# Patient Record
Sex: Female | Born: 1990 | Race: White | Hispanic: No | Marital: Married | Smoking: Never smoker
Health system: Southern US, Community
[De-identification: ages and names within clinical notes are randomized; demographics above are authoritative.]

## PROBLEM LIST (undated history)

## (undated) DIAGNOSIS — O24419 Gestational diabetes mellitus in pregnancy, unspecified control: Secondary | ICD-10-CM

## (undated) DIAGNOSIS — Z789 Other specified health status: Secondary | ICD-10-CM

## (undated) HISTORY — DX: Gestational diabetes mellitus in pregnancy, unspecified control: O24.419

## (undated) HISTORY — PX: NO PAST SURGERIES: SHX2092

---

## 2021-05-12 ENCOUNTER — Other Ambulatory Visit: Payer: Self-pay

## 2021-05-12 ENCOUNTER — Ambulatory Visit (INDEPENDENT_AMBULATORY_CARE_PROVIDER_SITE_OTHER): Admitting: Obstetrics and Gynecology

## 2021-05-12 ENCOUNTER — Encounter: Payer: Self-pay | Admitting: Obstetrics and Gynecology

## 2021-05-12 VITALS — BP 127/84 | HR 87 | Ht 63.0 in | Wt 164.3 lb

## 2021-05-12 DIAGNOSIS — Z7689 Persons encountering health services in other specified circumstances: Secondary | ICD-10-CM

## 2021-05-12 DIAGNOSIS — Z3200 Encounter for pregnancy test, result unknown: Secondary | ICD-10-CM

## 2021-05-12 LAB — POCT URINE PREGNANCY: Preg Test, Ur: POSITIVE — AB

## 2021-05-12 NOTE — Progress Notes (Signed)
HPI:      Ms. Benigna Delisi is a 30 y.o. G4P1003 who LMP was Patient's last menstrual period was 03/26/2021.  Subjective:   She presents today for pregnancy confirmation.  She is approximately 6 weeks estimated gestational age based on last menstrual period. She has 3 living children-vaginal births. Her pregnancies have been complicated by preeclampsia at term x2 and 1 induced at 36 weeks for PIH. She is interested in breast-feeding-she breast-fed one of her 3 children. She has significant nausea and occasional vomiting at this time.    Hx: The following portions of the patient's history were reviewed and updated as appropriate:             She  has no past medical history on file. She does not have a problem list on file. She  has no past surgical history on file. Her family history is not on file. She  has no history on file for tobacco use, alcohol use, and drug use. She has a current medication list which includes the following prescription(s): prenatal vit-fe fumarate-fa. She has no allergies on file.       Review of Systems:  Review of Systems  Constitutional: Denied constitutional symptoms, night sweats, recent illness, fatigue, fever, insomnia and weight loss.  Eyes: Denied eye symptoms, eye pain, photophobia, vision change and visual disturbance.  Ears/Nose/Throat/Neck: Denied ear, nose, throat or neck symptoms, hearing loss, nasal discharge, sinus congestion and sore throat.  Cardiovascular: Denied cardiovascular symptoms, arrhythmia, chest pain/pressure, edema, exercise intolerance, orthopnea and palpitations.  Respiratory: Denied pulmonary symptoms, asthma, pleuritic pain, productive sputum, cough, dyspnea and wheezing.  Gastrointestinal: Denied, gastro-esophageal reflux, melena, nausea and vomiting.  Genitourinary: Denied genitourinary symptoms including symptomatic vaginal discharge, pelvic relaxation issues, and urinary complaints.  Musculoskeletal: Denied  musculoskeletal symptoms, stiffness, swelling, muscle weakness and myalgia.  Dermatologic: Denied dermatology symptoms, rash and scar.  Neurologic: Denied neurology symptoms, dizziness, headache, neck pain and syncope.  Psychiatric: Denied psychiatric symptoms, anxiety and depression.  Endocrine: Denied endocrine symptoms including hot flashes and night sweats.   Meds:   Current Outpatient Medications on File Prior to Visit  Medication Sig Dispense Refill   Prenatal Vit-Fe Fumarate-FA (PRENATAL PO) Take by mouth.     No current facility-administered medications on file prior to visit.        The pregnancy intention screening data noted above was reviewed. Potential methods of contraception were discussed. The patient elected to proceed with Pregnant/Seeking Pregnancy.     Objective:     Vitals:   05/12/21 1003  BP: 127/84  Pulse: 87   Filed Weights   05/12/21 1003  Weight: 164 lb 4.8 oz (74.5 kg)              Urinary pregnancy test positive  Assessment:    G4P1003 There are no problems to display for this patient.    1. Possible pregnancy, not yet confirmed   2. Encounter to establish care     Approximately 6 weeks estimated gestational age  Significant history includes preeclampsia follow-up 3 prior births.   Plan:            Prenatal Plan 1.  The patient was given prenatal literature. 2.  She was continued on prenatal vitamins. 3.  A prenatal lab panel to be drawn at nurse visit. 4.  An ultrasound was ordered to better determine an EDC. 5.  A nurse visit was scheduled. 6.  Genetic testing and testing for other inheritable conditions discussed in detail.  She will decide in the future whether to have these labs performed. 7.  A general overview of pregnancy testing, visit schedule, ultrasound schedule, and prenatal care was discussed. 8.  COVID and its risks associated with pregnancy, prevention by limiting exposure and use of masks, as well as the risks  and benefits of vaccination during pregnancy were discussed in detail.  [facility] policy regarding office and hospital visitation and testing was explained. 9.  Benefits of breast-feeding discussed in detail including both maternal and infant benefits. Ready Set Baby website discussed. 10.  Literature on nausea and vomiting of pregnancy given 11.  Begin baby aspirin at approximately 13 weeks for history of preeclampsia  Orders Orders Placed This Encounter  Procedures   POCT urine pregnancy    No orders of the defined types were placed in this encounter.     F/U  Return in about 6 weeks (around 06/23/2021). I spent 32 minutes involved in the care of this patient preparing to see the patient by obtaining and reviewing her medical history (including labs, imaging tests and prior procedures), documenting clinical information in the electronic health record (EHR), counseling and coordinating care plans, writing and sending prescriptions, ordering tests or procedures and directly communicating with the patient by discussing pertinent items from her history and physical exam as well as detailing my assessment and plan as noted above so that she has an informed understanding.  All of her questions were answered.  Elonda Husky, M.D. 05/12/2021 10:36 AM

## 2021-05-16 ENCOUNTER — Telehealth: Payer: Self-pay | Admitting: Obstetrics and Gynecology

## 2021-05-16 NOTE — Telephone Encounter (Signed)
Pt aware charge correct dos for 7/1- done.

## 2021-05-16 NOTE — Telephone Encounter (Signed)
Kayla Reid called in and state son Friday July 1st when she was in the office, there were issues with trying to get her insurance verified.  She called to make sure it verified this morning.  I put in her information and it did verify.  She just wants to make sure her visit from July 1st gets filed under her insurance instead of self pay.

## 2021-06-02 ENCOUNTER — Ambulatory Visit (INDEPENDENT_AMBULATORY_CARE_PROVIDER_SITE_OTHER): Admitting: Obstetrics and Gynecology

## 2021-06-02 ENCOUNTER — Other Ambulatory Visit: Payer: Self-pay

## 2021-06-02 VITALS — BP 134/84 | HR 86 | Ht 63.0 in | Wt 165.7 lb

## 2021-06-02 DIAGNOSIS — Z3A09 9 weeks gestation of pregnancy: Secondary | ICD-10-CM

## 2021-06-02 DIAGNOSIS — Z3481 Encounter for supervision of other normal pregnancy, first trimester: Secondary | ICD-10-CM

## 2021-06-02 LAB — OB RESULTS CONSOLE VARICELLA ZOSTER ANTIBODY, IGG: Varicella: IMMUNE

## 2021-06-02 NOTE — Progress Notes (Signed)
Kayla Reid presents for NOB nurse interview visit. Pregnancy confirmation done 06/02/2021.  G4 .  P1003. Pregnancy education material explained and given. 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 NOB labs ordered.Body mass index is 29.35 kg/m. HIV labs and Drug screen were explained optional and she did not decline. Drug screen ordered. PNV encouraged. Genetic screening options discussed. Genetic testing: Unsure.  Pt to discuss with provider.  Financial policy reviewed. FMLA from reviewed and signed. Pt. To follow up with provider in 3 weeks for NOB physical.  All questions answered.

## 2021-06-03 LAB — MONITOR DRUG PROFILE 14(MW)
Amphetamine Scrn, Ur: NEGATIVE ng/mL
BARBITURATE SCREEN URINE: NEGATIVE ng/mL
BENZODIAZEPINE SCREEN, URINE: NEGATIVE ng/mL
Buprenorphine, Urine: NEGATIVE ng/mL
CANNABINOIDS UR QL SCN: NEGATIVE ng/mL
Cocaine (Metab) Scrn, Ur: NEGATIVE ng/mL
Creatinine(Crt), U: 185.7 mg/dL (ref 20.0–300.0)
Fentanyl, Urine: NEGATIVE pg/mL
Meperidine Screen, Urine: NEGATIVE ng/mL
Methadone Screen, Urine: NEGATIVE ng/mL
OXYCODONE+OXYMORPHONE UR QL SCN: NEGATIVE ng/mL
Opiate Scrn, Ur: NEGATIVE ng/mL
Ph of Urine: 7.2 (ref 4.5–8.9)
Phencyclidine Qn, Ur: NEGATIVE ng/mL
Propoxyphene Scrn, Ur: NEGATIVE ng/mL
SPECIFIC GRAVITY: 1.028
Tramadol Screen, Urine: NEGATIVE ng/mL

## 2021-06-03 LAB — URINALYSIS, ROUTINE W REFLEX MICROSCOPIC
Bilirubin, UA: NEGATIVE
Glucose, UA: NEGATIVE
Ketones, UA: NEGATIVE
Leukocytes,UA: NEGATIVE
Nitrite, UA: NEGATIVE
RBC, UA: NEGATIVE
Specific Gravity, UA: 1.026 (ref 1.005–1.030)
Urobilinogen, Ur: 1 mg/dL (ref 0.2–1.0)
pH, UA: 7 (ref 5.0–7.5)

## 2021-06-04 LAB — URINE CULTURE, OB REFLEX

## 2021-06-04 LAB — CULTURE, OB URINE

## 2021-06-06 LAB — GC/CHLAMYDIA PROBE AMP
Chlamydia trachomatis, NAA: NEGATIVE
Neisseria Gonorrhoeae by PCR: NEGATIVE

## 2021-06-08 LAB — AB SCR+ANTIBODY ID: Antibody Screen: POSITIVE — AB

## 2021-06-08 LAB — TOXOPLASMA ANTIBODIES- IGG AND  IGM
Toxoplasma Antibody- IgM: <3 AU/mL (ref 0.0–7.9)
Toxoplasma IgG Ratio: <3 IU/mL (ref 0.0–7.1)

## 2021-06-08 LAB — VARICELLA ZOSTER ANTIBODY, IGG: Varicella zoster IgG: 635 index (ref >165)

## 2021-06-08 LAB — NICOTINE/COTININE METABOLITES
Cotinine: <1 ng/mL
Nicotine: <1 ng/mL

## 2021-06-08 LAB — ANTIBODY SCREEN

## 2021-06-08 LAB — RPR: RPR Ser Ql: NONREACTIVE

## 2021-06-08 LAB — ABO AND RH: Rh Factor: POSITIVE

## 2021-06-08 LAB — RUBELLA SCREEN: Rubella Antibodies, IGG: 5.21 index (ref >0.99)

## 2021-06-08 LAB — HIV ANTIBODY (ROUTINE TESTING W REFLEX): HIV Screen 4th Generation wRfx: NONREACTIVE

## 2021-06-08 LAB — HEPATITIS B SURFACE ANTIGEN: Hepatitis B Surface Ag: NEGATIVE

## 2021-06-19 ENCOUNTER — Other Ambulatory Visit: Payer: Self-pay

## 2021-06-19 ENCOUNTER — Ambulatory Visit
Admission: RE | Admit: 2021-06-19 | Discharge: 2021-06-19 | Disposition: A | Discharge status: Home / Self Care | Source: Ambulatory Visit | Attending: Obstetrics and Gynecology | Admitting: Obstetrics and Gynecology

## 2021-06-19 DIAGNOSIS — Z3200 Encounter for pregnancy test, result unknown: Secondary | ICD-10-CM | POA: Diagnosis not present

## 2021-06-22 NOTE — Patient Instructions (Signed)
WHAT OB PATIENTS CAN EXPECT  Confirmation of pregnancy and ultrasound ordered if medically indicated-[redacted] weeks gestation New OB (NOB) intake with nurse and New OB (NOB) labs- [redacted] weeks gestation New OB (NOB) physical examination with provider- 11/[redacted] weeks gestation Flu vaccine-[redacted] weeks gestation Anatomy scan-[redacted] weeks gestation Glucose tolerance test, blood work to test for anemia, T-dap vaccine-[redacted] weeks gestation Vaginal swabs/cultures-STD/Group B strep-[redacted] weeks gestation Appointments every 4 weeks until 28 weeks Every 2 weeks from 28 weeks until 36 weeks Weekly visits from 36 weeks until delivery  Second Trimester of Pregnancy  The second trimester of pregnancy is from week 13 through week 27. This is also called months 4 through 6 of pregnancy. This is often the time when you feelyour best. During the second trimester: Morning sickness is less or has stopped. You may have more energy. You may feel hungry more often. At this time, your unborn baby (fetus) is growing very fast. At the end of the sixth month, the unborn baby may be up to 12 inches long and weigh about 1 pounds. You will likely start to feelthe baby move between 16 and 20 weeks of pregnancy. Body changes during your second trimester Your body continues to go through many changes during this time. The changesvary and generally return to normal after the baby is born. Physical changes You will gain more weight. You may start to get stretch marks on your hips, belly (abdomen), and breasts. Your breasts will grow and may hurt. Dark spots or blotches may develop on your face. A dark line from your belly button to the pubic area (linea nigra) may appear. You may have changes in your hair. Health changes You may have headaches. You may have heartburn. You may have trouble pooping (constipation). You may have hemorrhoids or swollen, bulging veins (varicose veins). Your gums may bleed. You may pee (urinate) more often. You may  have back pain. Follow these instructions at home: Medicines Take over-the-counter and prescription medicines only as told by your doctor. Some medicines are not safe during pregnancy. Take a prenatal vitamin that contains at least 600 micrograms (mcg) of folic acid. Eating and drinking Eat healthy meals that include: Fresh fruits and vegetables. Whole grains. Good sources of protein, such as meat, eggs, or tofu. Low-fat dairy products. Avoid raw meat and unpasteurized juice, milk, and cheese. You may need to take these actions to prevent or treat trouble pooping: Drink enough fluids to keep your pee (urine) pale yellow. Eat foods that are high in fiber. These include beans, whole grains, and fresh fruits and vegetables. Limit foods that are high in fat and sugar. These include fried or sweet foods. Activity Exercise only as told by your doctor. Most people can do their usual exercise during pregnancy. Try to exercise for 30 minutes at least 5 days a week. Stop exercising if you have pain or cramps in your belly or lower back. Do not exercise if it is too hot or too humid, or if you are in a place of great height (high altitude). Avoid heavy lifting. If you choose to, you may have sex unless your doctor tells you not to. Relieving pain and discomfort Wear a good support bra if your breasts are sore. Take warm water baths (sitz baths) to soothe pain or discomfort caused by hemorrhoids. Use hemorrhoid cream if your doctor approves. Rest with your legs raised (elevated) if you have leg cramps or low back pain. If you develop bulging veins in your legs: Wear  support hose as told by your doctor. Raise your feet for 15 minutes, 3-4 times a day. Limit salt in your food. Safety Wear your seat belt at all times when you are in a car. Talk with your doctor if someone is hurting you or yelling at you a lot. Lifestyle Do not use hot tubs, steam rooms, or saunas. Do not douche. Do not use  tampons or scented sanitary pads. Avoid cat litter boxes and soil used by cats. These carry germs that can harm your baby and can cause a loss of your baby by miscarriage or stillbirth. Do not use herbal medicines, illegal drugs, or medicines that are not approved by your doctor. Do not drink alcohol. Do not smoke or use any products that contain nicotine or tobacco. If you need help quitting, ask your doctor. General instructions Keep all follow-up visits. This is important. Ask your doctor about local prenatal classes. Ask your doctor about the right foods to eat or for help finding a counselor. Where to find more information American Pregnancy Association: americanpregnancy.org [employer]: www.acog.org Office on [employer]: KeywordPortfolios.com.br Contact a doctor if: You have a headache that does not go away when you take medicine. You have changes in how you see, or you see spots in front of your eyes. You have mild cramps, pressure, or pain in your lower belly. You continue to feel like you may vomit (nauseous), you vomit, or you have watery poop (diarrhea). You have bad-smelling fluid coming from your vagina. You have pain when you pee or your pee smells bad. You have very bad swelling of your face, hands, ankles, feet, or legs. You have a fever. Get help right away if: You are leaking fluid from your vagina. You have spotting or bleeding from your vagina. You have very bad belly cramping or pain. You have trouble breathing. You have chest pain. You faint. You have not felt your baby move for the time period told by your doctor. You have new or increased pain, swelling, or redness in an arm or leg. Summary The second trimester of pregnancy is from week 13 through week 27 (months 4 through 6). Eat healthy meals. Exercise as told by your doctor. Most people can do their usual exercise during pregnancy. Do not use herbal medicines,  illegal drugs, or medicines that are not approved by your doctor. Do not drink alcohol. Call your doctor if you get sick or if you notice anything unusual about your pregnancy. This information is not intended to replace advice given to you by your health care provider. Make sure you discuss any questions you have with your healthcare provider. Document Revised: 04/06/2020 Document Reviewed: 02/11/2020 Elsevier Patient Education  [place]. Common Medications Safe in Pregnancy  Acne:      Constipation:  Benzoyl Peroxide     Colace  Clindamycin      Dulcolax Suppository  Topica Erythromycin     Fibercon  Salicylic Acid      Metamucil         Miralax AVOID:        Senakot   Accutane    Cough:  Retin-A       Cough Drops  Tetracycline      Phenergan w/ Codeine if Rx  Minocycline      Robitussin (Plain & DM)  Antibiotics:     Crabs/Lice:  Ceclor       RID  Cephalosporins    AVOID:  E-Mycins  Kwell  Keflex  Macrobid/Macrodantin   Diarrhea:  Penicillin      Kao-Pectate  Zithromax      Imodium AD         PUSH FLUIDS AVOID:       Cipro     Fever:  Tetracycline      Tylenol (Regular or Extra  Minocycline       Strength)  Levaquin      Extra Strength-Do not          Exceed 8 tabs/24 hrs Caffeine:        <229m/day (equiv. To 1 cup of coffee or  approx. 3 12 oz sodas)         Gas: Cold/Hayfever:       Gas-X  Benadryl      Mylicon  Claritin       Phazyme  **Claritin-D        Chlor-Trimeton    Headaches:  Dimetapp      ASA-Free Excedrin  Drixoral-Non-Drowsy     Cold Compress  Mucinex (Guaifenasin)     Tylenol (Regular or Extra  Sudafed/Sudafed-12 Hour     Strength)  **Sudafed PE Pseudoephedrine   Tylenol Cold & Sinus     Vicks Vapor Rub  Zyrtec  **AVOID if Problems With Blood Pressure         Heartburn: Avoid lying down for at least 1 hour after meals  Aciphex      Maalox     Rash:  Milk of Magnesia     Benadryl    Mylanta       1% Hydrocortisone  Cream  Pepcid  Pepcid Complete   Sleep Aids:  Prevacid      Ambien   Prilosec       Benadryl  Rolaids       Chamomile Tea  Tums (Limit 4/day)     Unisom         Tylenol PM         Warm milk-add vanilla or  Hemorrhoids:       Sugar for taste  Anusol/Anusol H.C.  (RX: Analapram 2.5%)  Sugar Substitutes:  Hydrocortisone OTC     Ok in moderation  Preparation H      Tucks        Vaseline lotion applied to tissue with wiping    Herpes:     Throat:  Acyclovir      Oragel  Famvir  Valtrex     Vaccines:         Flu Shot Leg Cramps:       *Gardasil  Benadryl      Hepatitis A         Hepatitis B Nasal Spray:       Pneumovax  Saline Nasal Spray     Polio Booster         Tetanus Nausea:       Tuberculosis test or PPD  Vitamin B6 25 mg TID   AVOID:    Dramamine      *Gardasil  Emetrol       Live Poliovirus  Ginger Root 250 mg QID    MMR (measles, mumps &  High Complex Carbs @ Bedtime    rebella)  Sea Bands-Accupressure    Varicella (Chickenpox)  Unisom 1/2 tab TID     *No known complications           If received before Pain:         Known pregnancy;  Darvocet       Resume series after  Lortab        Delivery  Percocet    Yeast:   Tramadol      Femstat  Tylenol 3      Gyne-lotrimin  Ultram       Monistat  Vicodin           MISC:         All Sunscreens           Hair Coloring/highlights          Insect Repellant's          (Including DEET)         Mystic Tans

## 2021-06-23 ENCOUNTER — Encounter: Payer: Self-pay | Admitting: Obstetrics and Gynecology

## 2021-06-23 ENCOUNTER — Other Ambulatory Visit (HOSPITAL_COMMUNITY)
Admission: RE | Admit: 2021-06-23 | Discharge: 2021-06-23 | Disposition: A | Discharge status: Home / Self Care | Source: Ambulatory Visit | Attending: Obstetrics and Gynecology | Admitting: Obstetrics and Gynecology

## 2021-06-23 ENCOUNTER — Ambulatory Visit (INDEPENDENT_AMBULATORY_CARE_PROVIDER_SITE_OTHER): Admitting: Obstetrics and Gynecology

## 2021-06-23 ENCOUNTER — Other Ambulatory Visit: Payer: Self-pay

## 2021-06-23 VITALS — BP 142/87 | HR 80 | Wt 161.8 lb

## 2021-06-23 DIAGNOSIS — Z124 Encounter for screening for malignant neoplasm of cervix: Secondary | ICD-10-CM

## 2021-06-23 DIAGNOSIS — Z3A12 12 weeks gestation of pregnancy: Secondary | ICD-10-CM

## 2021-06-23 DIAGNOSIS — Z3481 Encounter for supervision of other normal pregnancy, first trimester: Secondary | ICD-10-CM

## 2021-06-23 DIAGNOSIS — Z3491 Encounter for supervision of normal pregnancy, unspecified, first trimester: Secondary | ICD-10-CM

## 2021-06-23 LAB — POCT URINALYSIS DIPSTICK OB
Bilirubin, UA: NEGATIVE
Blood, UA: NEGATIVE
Glucose, UA: NEGATIVE
Ketones, UA: NEGATIVE
Leukocytes, UA: NEGATIVE
Nitrite, UA: NEGATIVE
POC,PROTEIN,UA: NEGATIVE
Spec Grav, UA: 1.015 (ref 1.010–1.025)
Urobilinogen, UA: 0.2 E.U./dL
pH, UA: 6.5 (ref 5.0–8.0)

## 2021-06-23 NOTE — Progress Notes (Signed)
NOB: Currently doing well.  Nausea and vomiting have decreased.  Patient declined genetic testing.  She does desire quad screen at 16 weeks.  She has begun aspirin as directed because of her history of preeclampsia.  Pap performed.  Physical examination General NAD, Conversant  HEENT Atraumatic; Op clear with mmm.  Normo-cephalic. Pupils reactive. Anicteric sclerae  Thyroid/Neck Smooth without nodularity or enlargement. Normal ROM.  Neck Supple.  Skin No rashes, lesions or ulceration. Normal palpated skin turgor. No nodularity.  Breasts: No masses or discharge.  Symmetric.  No axillary adenopathy.  Lungs: Clear to auscultation.No rales or wheezes. Normal Respiratory effort, no retractions.  Heart: NSR.  No murmurs or rubs appreciated. No periferal edema  Abdomen: Soft.  Non-tender.  No masses.  No HSM. No hernia  Extremities: Moves all appropriately.  Normal ROM for age. No lymphadenopathy.  Neuro: Oriented to PPT.  Normal mood. Normal affect.     Pelvic:   Vulva: Normal appearance.  No lesions.  Vagina: No lesions or abnormalities noted.  Support: Normal pelvic support.  Urethra No masses tenderness or scarring.  Meatus Normal size without lesions or prolapse.  Cervix: Normal appearance.  No lesions.  Anus: Normal exam.  No lesions.  Perineum: Normal exam.  No lesions.        Bimanual   Adnexae: No masses.  Non-tender to palpation.  Uterus: Enlarged. Pos FHTs  Non-tender.  Mobile.  AV.  Adnexae: No masses.  Non-tender to palpation.  Cul-de-sac: Negative for abnormality.  Adnexae: No masses.  Non-tender to palpation.         Pelvimetry   Diagonal: Reached.  Spines: Average.  Sacrum: Concave.  Pubic Arch: Normal.

## 2021-06-23 NOTE — Progress Notes (Signed)
NOB-PE for initial prenatal care. Pt stated that she was doing well. Pt declined genetic testing. Pt stated noticing light bloody show. Pt stated that her last pap smear was about a year ago.

## 2021-06-28 LAB — CYTOLOGY - PAP
Comment: NEGATIVE
Diagnosis: NEGATIVE
High risk HPV: NEGATIVE

## 2021-07-18 ENCOUNTER — Telehealth: Payer: Self-pay

## 2021-07-18 NOTE — Telephone Encounter (Signed)
Call transferred from front desk-   OB 16 2/7 Pt woke up from a nap and has blurred vision in the rt eye. BP- 159 95 at 430  Mild dizziness. No H/a. Pt has had high bp in every pregnancy. Is taking asa 81mg . Not taking bp meds.   Pt advised to take a bp log this evening. Appt for bp check at 830. IF pt has increased dizziness or H/a not relieved with tylenol to go to the ed. Pt voiced understanding.

## 2021-07-19 ENCOUNTER — Other Ambulatory Visit: Payer: Self-pay

## 2021-07-19 ENCOUNTER — Ambulatory Visit (INDEPENDENT_AMBULATORY_CARE_PROVIDER_SITE_OTHER): Admitting: Obstetrics and Gynecology

## 2021-07-19 VITALS — BP 123/84 | HR 83 | Ht 63.0 in | Wt 163.8 lb

## 2021-07-19 DIAGNOSIS — Z0130 Encounter for examination of blood pressure without abnormal findings: Secondary | ICD-10-CM

## 2021-07-19 DIAGNOSIS — Z3A16 16 weeks gestation of pregnancy: Secondary | ICD-10-CM

## 2021-07-19 DIAGNOSIS — Z3482 Encounter for supervision of other normal pregnancy, second trimester: Secondary | ICD-10-CM | POA: Diagnosis not present

## 2021-07-19 LAB — POCT URINALYSIS DIPSTICK OB
Bilirubin, UA: NEGATIVE
Blood, UA: NEGATIVE
Glucose, UA: NEGATIVE
Ketones, UA: NEGATIVE
Leukocytes, UA: NEGATIVE
Nitrite, UA: NEGATIVE
POC,PROTEIN,UA: NEGATIVE
Spec Grav, UA: <=1.005 — AB (ref 1.010–1.025)
Urobilinogen, UA: 0.2 E.U./dL
pH, UA: 6.5 (ref 5.0–8.0)

## 2021-07-19 NOTE — Patient Instructions (Signed)

## 2021-07-19 NOTE — Progress Notes (Signed)
OB pt called yesterday stating that she had elevated bp. Pt stated that her bp were elevated. Pt stated today that she is having blurred vision in the right eye, no headaches, dizziness, vomiting 4x a day, this morning when she urinated she noticed a quarter size clot come out, afterwards light bleeding which has stopped, light pain in right pelvic area and tick on her right ankle.

## 2021-07-19 NOTE — Progress Notes (Signed)
HPI:      [name] is a 30 y.o. [phone] who LMP was Patient's last menstrual period was 03/26/2021.  Subjective:   She presents today for a blood pressure check because she was concerned that her blood pressure could possibly be high.  She also informed the nurse that she had an episode of vaginal bleeding.  She denies contractions denies any further bleeding since that time.  She was very concerned that she could be having a miscarriage.    Hx: The following portions of the patient's history were reviewed and updated as appropriate:             She  has no past medical history on file. She does not have a problem list on file. She  has no past surgical history on file. Her family history includes Diabetes in her maternal grandmother and paternal grandmother; Healthy in her mother; Hypertension in her father; Multiple sclerosis in her father. She  reports that she has never smoked. She has never used smokeless tobacco. She reports that she does not drink alcohol and does not use drugs. She has a current medication list which includes the following prescription(s): aspirin and prenatal vit-fe fumarate-fa. She has No Known Allergies.       Review of Systems:  Review of Systems  Constitutional: Denied constitutional symptoms, night sweats, recent illness, fatigue, fever, insomnia and weight loss.  Eyes: Denied eye symptoms, eye pain, photophobia, vision change and visual disturbance.  Ears/Nose/Throat/Neck: Denied ear, nose, throat or neck symptoms, hearing loss, nasal discharge, sinus congestion and sore throat.  Cardiovascular: Denied cardiovascular symptoms, arrhythmia, chest pain/pressure, edema, exercise intolerance, orthopnea and palpitations.  Respiratory: Denied pulmonary symptoms, asthma, pleuritic pain, productive sputum, cough, dyspnea and wheezing.  Gastrointestinal: Denied, gastro-esophageal reflux, melena, nausea and vomiting.  Genitourinary: See HPI for additional  information.  Musculoskeletal: Denied musculoskeletal symptoms, stiffness, swelling, muscle weakness and myalgia.  Dermatologic: Denied dermatology symptoms, rash and scar.  Neurologic: Denied neurology symptoms, dizziness, headache, neck pain and syncope.  Psychiatric: Denied psychiatric symptoms, anxiety and depression.  Endocrine: Denied endocrine symptoms including hot flashes and night sweats.   Meds:   Current Outpatient Medications on File Prior to Visit  Medication Sig Dispense Refill   BABY ASPIRIN PO Take by mouth.     Prenatal Vit-Fe Fumarate-FA (PRENATAL PO) Take by mouth.     No current facility-administered medications on file prior to visit.      Objective:     Vitals:   07/19/21 0825  BP: 123/84  Pulse: 83   Filed Weights   07/19/21 0825  Weight: 163 lb 12.8 oz (74.3 kg)              Fetal heart tones noted at 156.          Assessment:    [id number] There are no problems to display for this patient.    1. Encounter for supervision of other normal pregnancy in second trimester   2. [redacted] weeks gestation of pregnancy     Patient had 1 episode of spotting which has now resolved.  She was concerned for miscarriage   Plan:            1.  Fetal heart tones heard-patient reassured regarding miscarriage.  2.  Blood pressure check today normal Orders Orders Placed This Encounter  Procedures   POC Urinalysis Dipstick OB    No orders of the defined types were placed in this encounter.  F/U  No follow-ups on file. Patient to follow-up as previously scheduled tomorrow.  I spent 15 minutes involved in the care of this patient preparing to see the patient by obtaining and reviewing her medical history (including labs, imaging tests and prior procedures), documenting clinical information in the electronic health record (EHR), counseling and coordinating care plans, writing and sending prescriptions, ordering tests or procedures and in direct communicating  with the patient and medical staff discussing pertinent items from her history and physical exam.  Elonda Husky, M.D. 07/19/2021 9:19 AM

## 2021-07-20 ENCOUNTER — Encounter: Payer: Self-pay | Admitting: Obstetrics and Gynecology

## 2021-07-20 ENCOUNTER — Other Ambulatory Visit: Payer: Self-pay

## 2021-07-20 ENCOUNTER — Ambulatory Visit (INDEPENDENT_AMBULATORY_CARE_PROVIDER_SITE_OTHER): Admitting: Obstetrics and Gynecology

## 2021-07-20 VITALS — BP 108/77 | HR 81 | Wt 163.4 lb

## 2021-07-20 DIAGNOSIS — O0992 Supervision of high risk pregnancy, unspecified, second trimester: Secondary | ICD-10-CM

## 2021-07-20 DIAGNOSIS — Z3482 Encounter for supervision of other normal pregnancy, second trimester: Secondary | ICD-10-CM

## 2021-07-20 DIAGNOSIS — R768 Other specified abnormal immunological findings in serum: Secondary | ICD-10-CM | POA: Insufficient documentation

## 2021-07-20 DIAGNOSIS — O09292 Supervision of pregnancy with other poor reproductive or obstetric history, second trimester: Secondary | ICD-10-CM | POA: Insufficient documentation

## 2021-07-20 DIAGNOSIS — Z3A16 16 weeks gestation of pregnancy: Secondary | ICD-10-CM

## 2021-07-20 DIAGNOSIS — O09213 Supervision of pregnancy with history of pre-term labor, third trimester: Secondary | ICD-10-CM | POA: Insufficient documentation

## 2021-07-20 LAB — POCT URINALYSIS DIPSTICK OB
Bilirubin, UA: NEGATIVE
Blood, UA: NEGATIVE
Glucose, UA: NEGATIVE
Ketones, UA: NEGATIVE
Leukocytes, UA: NEGATIVE
Nitrite, UA: NEGATIVE
POC,PROTEIN,UA: NEGATIVE
Spec Grav, UA: 1.025 (ref 1.010–1.025)
Urobilinogen, UA: 0.2 E.U./dL
pH, UA: 7 (ref 5.0–8.0)

## 2021-07-20 NOTE — Progress Notes (Signed)
ROB: Patient notes she was seen yesterday in office after elevated BP at home and passing a large blood clot. Was experiencing vision changes in left eye.  Notes vision change is still present, but improving. Has had no other episodes of bleeding. BP today normal. Will order baseline PIH labs as patient with prior h/o pre-eclampsia. Discussed risk for cHTN, gestational HTN and pre-eclampsia again this pregnancy.  Reviewed prenatal labs, has Anti-E antibody, is aware. Is taking aspirin daily. Discussed genetic testing, patient notes her insurance does not cover. Discussed cash pay price with patient which she is ok with. Will order Panorama/Hor[facility] today.  For anatomy scan in 4 weeks. RTC in 4 weeks.

## 2021-07-20 NOTE — Progress Notes (Signed)
   OB-Pt present for routine prenatal care. Pt stated fetal movement present; no contractions present; light bloody vaginal bleeding and no changes in vaginal discharge.     Pt stated that she was doing well and a lot better than yesterday when she was seen in the office for vaginal bleeding, elevated bp and abd cramping.

## 2021-07-21 LAB — COMPREHENSIVE METABOLIC PANEL
ALT: 10 IU/L (ref 0–32)
AST: 11 IU/L (ref 0–40)
Albumin/Globulin Ratio: 1.7 (ref 1.2–2.2)
Albumin: 4.3 g/dL (ref 3.8–4.8)
Alkaline Phosphatase: 36 IU/L — ABNORMAL LOW (ref 44–121)
BUN/Creatinine Ratio: 16 (ref 9–23)
BUN: 8 mg/dL (ref 6–20)
Bilirubin Total: 0.3 mg/dL (ref 0.0–1.2)
CO2: 20 mmol/L (ref 20–29)
Calcium: 9 mg/dL (ref 8.7–10.2)
Chloride: 103 mmol/L (ref 96–106)
Creatinine, Ser: 0.5 mg/dL — ABNORMAL LOW (ref 0.57–1.00)
Globulin, Total: 2.5 g/dL (ref 1.5–4.5)
Glucose: 70 mg/dL (ref 65–99)
Potassium: 4.2 mmol/L (ref 3.5–5.2)
Sodium: 137 mmol/L (ref 134–144)
Total Protein: 6.8 g/dL (ref 6.0–8.5)
eGFR: 129 mL/min/{1.73_m2} (ref >59)

## 2021-08-17 ENCOUNTER — Ambulatory Visit (INDEPENDENT_AMBULATORY_CARE_PROVIDER_SITE_OTHER)

## 2021-08-17 ENCOUNTER — Ambulatory Visit (INDEPENDENT_AMBULATORY_CARE_PROVIDER_SITE_OTHER): Admitting: Obstetrics and Gynecology

## 2021-08-17 ENCOUNTER — Other Ambulatory Visit: Payer: Self-pay

## 2021-08-17 ENCOUNTER — Encounter: Payer: Self-pay | Admitting: Obstetrics and Gynecology

## 2021-08-17 VITALS — BP 111/76 | HR 82 | Wt 170.6 lb

## 2021-08-17 DIAGNOSIS — O0992 Supervision of high risk pregnancy, unspecified, second trimester: Secondary | ICD-10-CM

## 2021-08-17 DIAGNOSIS — Z3A20 20 weeks gestation of pregnancy: Secondary | ICD-10-CM

## 2021-08-17 LAB — POCT URINALYSIS DIPSTICK OB
Bilirubin, UA: NEGATIVE
Blood, UA: NEGATIVE
Glucose, UA: NEGATIVE
Ketones, UA: NEGATIVE
Leukocytes, UA: NEGATIVE
Nitrite, UA: NEGATIVE
POC,PROTEIN,UA: NEGATIVE
Spec Grav, UA: 1.01 (ref 1.010–1.025)
Urobilinogen, UA: 0.2 E.U./dL
pH, UA: 6.5 (ref 5.0–8.0)

## 2021-08-17 NOTE — Progress Notes (Signed)
ROB: Blood pressure is excellent today.  She has no complaints.  She is feeling the baby move.  Anatomy ultrasound today. (Already celebrating Halloween!)

## 2021-09-13 ENCOUNTER — Encounter: Payer: Self-pay | Admitting: Obstetrics and Gynecology

## 2021-09-13 ENCOUNTER — Encounter: Admitting: Obstetrics and Gynecology

## 2021-09-22 ENCOUNTER — Ambulatory Visit (INDEPENDENT_AMBULATORY_CARE_PROVIDER_SITE_OTHER): Admitting: Obstetrics and Gynecology

## 2021-09-22 ENCOUNTER — Other Ambulatory Visit: Payer: Self-pay

## 2021-09-22 VITALS — BP 122/79 | HR 92 | Wt 180.8 lb

## 2021-09-22 DIAGNOSIS — Z23 Encounter for immunization: Secondary | ICD-10-CM

## 2021-09-22 DIAGNOSIS — Z3A25 25 weeks gestation of pregnancy: Secondary | ICD-10-CM

## 2021-09-22 DIAGNOSIS — Z8751 Personal history of pre-term labor: Secondary | ICD-10-CM

## 2021-09-22 DIAGNOSIS — O09292 Supervision of pregnancy with other poor reproductive or obstetric history, second trimester: Secondary | ICD-10-CM

## 2021-09-22 DIAGNOSIS — O0992 Supervision of high risk pregnancy, unspecified, second trimester: Secondary | ICD-10-CM

## 2021-09-22 DIAGNOSIS — Z131 Encounter for screening for diabetes mellitus: Secondary | ICD-10-CM

## 2021-09-22 LAB — POCT URINALYSIS DIPSTICK OB
Bilirubin, UA: NEGATIVE
Blood, UA: NEGATIVE
Glucose, UA: NEGATIVE
Ketones, UA: NEGATIVE
Leukocytes, UA: NEGATIVE
Nitrite, UA: NEGATIVE
POC,PROTEIN,UA: NEGATIVE
Spec Grav, UA: 1.01
Urobilinogen, UA: 0.2 U/dL
pH, UA: 7

## 2021-09-22 NOTE — Progress Notes (Signed)
ROB: Patient noting some pelvic pain and pressure.  Notes it is mostly associated with fetal movement. Discussed belly band.  Flu vaccine given today.  RTC in 3 weeks, for OB visit and 28 week labs.

## 2021-09-22 NOTE — Progress Notes (Signed)
ROB; patient reports pelvic pain at a 6/10 pain score.

## 2021-10-12 ENCOUNTER — Ambulatory Visit (INDEPENDENT_AMBULATORY_CARE_PROVIDER_SITE_OTHER): Admitting: Obstetrics and Gynecology

## 2021-10-12 ENCOUNTER — Other Ambulatory Visit

## 2021-10-12 ENCOUNTER — Encounter: Payer: Self-pay | Admitting: Obstetrics and Gynecology

## 2021-10-12 ENCOUNTER — Other Ambulatory Visit: Payer: Self-pay

## 2021-10-12 VITALS — BP 112/78 | HR 94 | Wt 183.2 lb

## 2021-10-12 DIAGNOSIS — Z3A28 28 weeks gestation of pregnancy: Secondary | ICD-10-CM

## 2021-10-12 DIAGNOSIS — Z3483 Encounter for supervision of other normal pregnancy, third trimester: Secondary | ICD-10-CM

## 2021-10-12 DIAGNOSIS — O0992 Supervision of high risk pregnancy, unspecified, second trimester: Secondary | ICD-10-CM

## 2021-10-12 DIAGNOSIS — Z23 Encounter for immunization: Secondary | ICD-10-CM

## 2021-10-12 DIAGNOSIS — Z131 Encounter for screening for diabetes mellitus: Secondary | ICD-10-CM

## 2021-10-12 LAB — POCT URINALYSIS DIPSTICK OB
Bilirubin, UA: NEGATIVE
Blood, UA: NEGATIVE
Glucose, UA: NEGATIVE
Ketones, UA: NEGATIVE
Leukocytes, UA: NEGATIVE
Nitrite, UA: NEGATIVE
POC,PROTEIN,UA: NEGATIVE
Spec Grav, UA: 1.02 (ref 1.010–1.025)
Urobilinogen, UA: 0.2 E.U./dL
pH, UA: 7 (ref 5.0–8.0)

## 2021-10-12 MED ORDER — TETANUS-DIPHTH-ACELL PERTUSSIS 5-2.5-18.5 LF-MCG/0.5 IM SUSY
0.5000 mL | PREFILLED_SYRINGE | Freq: Once | INTRAMUSCULAR | Status: AC
  Administered 2021-10-12: 0.5 mL via INTRAMUSCULAR

## 2021-10-12 NOTE — Progress Notes (Signed)
ROB. Patient states no concerns or complaints. Patient received Tdap vaccine and signed Blood Transfusion consent form.

## 2021-10-12 NOTE — Progress Notes (Signed)
ROB: She is doing well with no issues.  Blood pressure remains good.  Taking vitamins and aspirin as directed.  Reports fetal movement.  1 hour GCT performed today.

## 2021-10-13 LAB — CBC
Hematocrit: 31.3 % — ABNORMAL LOW (ref 34.0–46.6)
Hemoglobin: 11 g/dL — ABNORMAL LOW (ref 11.1–15.9)
MCH: 32 pg (ref 26.6–33.0)
MCHC: 35.1 g/dL (ref 31.5–35.7)
MCV: 91 fL (ref 79–97)
Platelets: 146 10*3/uL — ABNORMAL LOW (ref 150–450)
RBC: 3.44 x10E6/uL — ABNORMAL LOW (ref 3.77–5.28)
RDW: 12.5 % (ref 11.7–15.4)
WBC: 7.1 10*3/uL (ref 3.4–10.8)

## 2021-10-13 LAB — GLUCOSE, 1 HOUR GESTATIONAL: Gestational Diabetes Screen: 164 mg/dL — ABNORMAL HIGH (ref 70–139)

## 2021-10-13 LAB — HEPATITIS C ANTIBODY: Hep C Virus Ab: <0.1 s/co ratio (ref 0.0–0.9)

## 2021-10-13 LAB — RPR: RPR Ser Ql: NONREACTIVE

## 2021-10-14 ENCOUNTER — Other Ambulatory Visit: Payer: Self-pay

## 2021-10-14 ENCOUNTER — Encounter: Payer: Self-pay | Admitting: Obstetrics and Gynecology

## 2021-10-14 ENCOUNTER — Observation Stay
Admission: EM | Admit: 2021-10-14 | Discharge: 2021-10-14 | Disposition: A | Discharge status: Home / Self Care | Attending: Obstetrics and Gynecology | Admitting: Obstetrics and Gynecology

## 2021-10-14 DIAGNOSIS — Z8759 Personal history of other complications of pregnancy, childbirth and the puerperium: Secondary | ICD-10-CM | POA: Diagnosis not present

## 2021-10-14 DIAGNOSIS — Z3A28 28 weeks gestation of pregnancy: Secondary | ICD-10-CM | POA: Diagnosis not present

## 2021-10-14 DIAGNOSIS — O26893 Other specified pregnancy related conditions, third trimester: Secondary | ICD-10-CM

## 2021-10-14 DIAGNOSIS — O09893 Supervision of other high risk pregnancies, third trimester: Secondary | ICD-10-CM | POA: Diagnosis not present

## 2021-10-14 DIAGNOSIS — O4703 False labor before 37 completed weeks of gestation, third trimester: Secondary | ICD-10-CM | POA: Diagnosis not present

## 2021-10-14 DIAGNOSIS — Z8751 Personal history of pre-term labor: Secondary | ICD-10-CM

## 2021-10-14 DIAGNOSIS — O09213 Supervision of pregnancy with history of pre-term labor, third trimester: Secondary | ICD-10-CM | POA: Diagnosis not present

## 2021-10-14 DIAGNOSIS — Z3490 Encounter for supervision of normal pregnancy, unspecified, unspecified trimester: Secondary | ICD-10-CM

## 2021-10-14 HISTORY — DX: Other specified health status: Z78.9

## 2021-10-14 LAB — URINALYSIS, ROUTINE W REFLEX MICROSCOPIC
Bilirubin Urine: NEGATIVE
Glucose, UA: NEGATIVE mg/dL
Hgb urine dipstick: NEGATIVE
Ketones, ur: 40 mg/dL — AB
Nitrite: NEGATIVE
Protein, ur: NEGATIVE mg/dL
Specific Gravity, Urine: 1.015 (ref 1.005–1.030)
pH: 7 (ref 5.0–8.0)

## 2021-10-14 LAB — RUPTURE OF MEMBRANE (ROM)PLUS: Rom Plus: NEGATIVE

## 2021-10-14 MED ORDER — NIFEDIPINE 10 MG PO CAPS
20.0000 mg | ORAL_CAPSULE | Freq: Once | ORAL | Status: AC
  Administered 2021-10-14: 20 mg via ORAL
  Filled 2021-10-14: qty 2

## 2021-10-14 MED ORDER — AMMONIA AROMATIC IN INHA
RESPIRATORY_TRACT | Status: AC
  Filled 2021-10-14: qty 10

## 2021-10-14 MED ORDER — LACTATED RINGERS IV BOLUS
1000.0000 mL | Freq: Once | INTRAVENOUS | Status: AC
  Administered 2021-10-14 (×2): 1000 mL via INTRAVENOUS

## 2021-10-14 MED ORDER — ACETAMINOPHEN 500 MG PO TABS
1000.0000 mg | ORAL_TABLET | Freq: Four times a day (QID) | ORAL | Status: DC | PRN
  Administered 2021-10-14: 1000 mg via ORAL
  Filled 2021-10-14: qty 2

## 2021-10-14 NOTE — Discharge Instructions (Signed)

## 2021-10-14 NOTE — OB Triage Note (Signed)
Patient arrive from ER with complaints of SROM around 1430. Patient states there was  a large gush of fluid when she stood up after voiding with small tinges of pink. Pt states she has had irregular contractions 5-7 apart. Pt states baby is moving well, denies vaginal bleeding. Pt states she has had history of preE- denies HA, epigastric pain, but does complain of floaters on right eye that started on the way here.

## 2021-10-14 NOTE — Final Progress Note (Signed)
L&D OB Triage Note  Kayla Reid is a 30 y.o. [id number] female at [redacted]w[redacted]d, EDD Estimated Date of Delivery: 12/31/21 who presented to triage for complaints of possible leakage of fluids.  She has a history of preterm delivery, and pre-eclampsia in prior pregnancy. She was evaluated by the nurses with findings significant for preterm contractions.  Of note, patient reports intercourse within the past 24 hours. Vital signs stable. An NST was performed and has been reviewed by MD. She was treated with IVF fluids and a dose of Procardia 20 mg.   NST INTERPRETATION: Indications: rule out uterine contractions  Mode: External Baseline Rate (A): 145 bpm Variability: Moderate Accelerations: None Decelerations: None     Contraction Frequency (min): 2-4  Impression: reactive   Labs:  Results for orders placed or performed during the hospital encounter of 10/14/21  ROM Plus ([facility] only)  Result Value Ref Range   Rom Plus NEGATIVE     Plan: NST performed was reviewed and was found to be reactive. She was discharged home with bleeding/preterm labor precautions.  Continue routine prenatal care. Follow up with OB/GYN as previously scheduled.     Hildred Laser, MD [facility]

## 2021-10-14 NOTE — Progress Notes (Signed)
Pt reports feeling decreased contractions. Notified MD at 2224.

## 2021-10-14 NOTE — OB Triage Note (Signed)
Pt discharged home in stable condition by provider. RN provided discharge instructions to pt, including labor precautions. Pt verbalized understanding and all questions answered at this time.

## 2021-10-14 NOTE — Progress Notes (Signed)
Pt reports still feeling contractions after receiving liter bolus of LR. RN notified MD of update. See new orders.

## 2021-10-19 ENCOUNTER — Other Ambulatory Visit

## 2021-10-19 DIAGNOSIS — Z131 Encounter for screening for diabetes mellitus: Secondary | ICD-10-CM

## 2021-10-26 ENCOUNTER — Other Ambulatory Visit

## 2021-10-27 ENCOUNTER — Encounter: Admitting: Obstetrics and Gynecology

## 2021-10-31 ENCOUNTER — Other Ambulatory Visit

## 2021-10-31 ENCOUNTER — Other Ambulatory Visit: Payer: Self-pay

## 2021-10-31 DIAGNOSIS — Z131 Encounter for screening for diabetes mellitus: Secondary | ICD-10-CM

## 2021-11-01 ENCOUNTER — Encounter: Payer: Self-pay | Admitting: Obstetrics and Gynecology

## 2021-11-01 ENCOUNTER — Ambulatory Visit (INDEPENDENT_AMBULATORY_CARE_PROVIDER_SITE_OTHER): Admitting: Obstetrics and Gynecology

## 2021-11-01 VITALS — BP 121/89 | HR 87 | Wt 174.1 lb

## 2021-11-01 DIAGNOSIS — Z8632 Personal history of gestational diabetes: Secondary | ICD-10-CM | POA: Insufficient documentation

## 2021-11-01 DIAGNOSIS — O24410 Gestational diabetes mellitus in pregnancy, diet controlled: Secondary | ICD-10-CM | POA: Insufficient documentation

## 2021-11-01 DIAGNOSIS — O09292 Supervision of pregnancy with other poor reproductive or obstetric history, second trimester: Secondary | ICD-10-CM

## 2021-11-01 DIAGNOSIS — O0993 Supervision of high risk pregnancy, unspecified, third trimester: Secondary | ICD-10-CM

## 2021-11-01 DIAGNOSIS — Z3A31 31 weeks gestation of pregnancy: Secondary | ICD-10-CM

## 2021-11-01 LAB — GESTATIONAL GLUCOSE TOLERANCE
Glucose, Fasting: 103 mg/dL — ABNORMAL HIGH (ref 70–94)
Glucose, GTT - 1 Hour: 197 mg/dL — ABNORMAL HIGH (ref 70–179)
Glucose, GTT - 2 Hour: 200 mg/dL — ABNORMAL HIGH (ref 70–154)
Glucose, GTT - 3 Hour: 146 mg/dL — ABNORMAL HIGH (ref 70–139)

## 2021-11-01 LAB — POCT URINALYSIS DIPSTICK OB
Bilirubin, UA: NEGATIVE
Blood, UA: NEGATIVE
Glucose, UA: NEGATIVE
Ketones, UA: NEGATIVE
Leukocytes, UA: NEGATIVE
Nitrite, UA: NEGATIVE
POC,PROTEIN,UA: NEGATIVE
Spec Grav, UA: 1.01 (ref 1.010–1.025)
Urobilinogen, UA: 0.2 E.U./dL
pH, UA: 7 (ref 5.0–8.0)

## 2021-11-01 NOTE — Progress Notes (Signed)
ROB: She is doing well, no concerns today. 

## 2021-11-01 NOTE — Progress Notes (Signed)
ROB: Doing well, no issues. Reviewed labs, failed 3 hr GTT, referred to [facility].  Plans to breastfeed, desires Vasectomy for contraception. For Tdap today, signed blood consent. Discussed need for growth scan and antenatal testing at 36 weeks.

## 2021-11-09 MED FILL — Lactated Ringer's Solution: INTRAVENOUS | Qty: 1000 | Status: AC

## 2021-11-10 ENCOUNTER — Ambulatory Visit: Admitting: *Deleted

## 2021-11-12 NOTE — L&D Delivery Note (Signed)
° ° °     Delivery Note   Kayla Reid is a 31 y.o. [id number] at [redacted]w[redacted]d Estimated Date of Delivery: 12/31/21  PRE-OPERATIVE DIAGNOSIS:  1) [redacted]w[redacted]d pregnancy.  2) spontaneous labor  POST-OPERATIVE DIAGNOSIS:  1) [redacted]w[redacted]d pregnancy s/p Vaginal, Spontaneous  2) same with viable female infant  Delivery Type: Vaginal, Spontaneous    Delivery Anesthesia: None     ESTIMATED BLOOD LOSS: 275 ml    FINDINGS:   1) female infant, Apgar scores of 7   at 1 minute and 9   at 5 minutes and a birthweight of 123.81  ounces.    2) Nuchal cord: No  SPECIMENS:   PLACENTA:   Appearance: Intact    Removal: Spontaneous      Disposition:    DISPOSITION:  Infant to left in stable condition in the delivery room, with L&D personnel and mother,  NARRATIVE SUMMARY: Labor course:  [name] is a [id number] at [redacted]w[redacted]d who presented for labor management having regular contractions.  She was found to be 9 cm upon admission.  Artificial rupture of membranes was performed and clear fluid was noted.  Within 10 minutes patient felt the urge to push and she was found to be completely dilated.  She pushed only a few times and delivered a viable female infant. The placenta delivered without problems and was noted to be complete. A perineal and vaginal examination was performed. Episiotomy/Lacerations: 1st degree  Episiotomy or lacerations were repaired with Vicryl suture. The patient tolerated this well.  Elonda Husky, M.D. 12/21/2021 8:19 AM

## 2021-11-14 ENCOUNTER — Encounter: Attending: Obstetrics and Gynecology | Admitting: *Deleted

## 2021-11-14 ENCOUNTER — Telehealth: Payer: Self-pay | Admitting: Obstetrics and Gynecology

## 2021-11-14 ENCOUNTER — Encounter: Payer: Self-pay | Admitting: *Deleted

## 2021-11-14 ENCOUNTER — Other Ambulatory Visit: Payer: Self-pay

## 2021-11-14 VITALS — BP 92/60 | Ht 63.0 in | Wt 187.2 lb

## 2021-11-14 DIAGNOSIS — Z3A00 Weeks of gestation of pregnancy not specified: Secondary | ICD-10-CM | POA: Insufficient documentation

## 2021-11-14 DIAGNOSIS — O24410 Gestational diabetes mellitus in pregnancy, diet controlled: Secondary | ICD-10-CM | POA: Insufficient documentation

## 2021-11-14 NOTE — Telephone Encounter (Signed)
Pt had consult with nutrion for diabetes during pregnancy die to insurance she needs rx for glucometer  meter strips and lancets "Freestyle Lite". Confirmed pharmacy as [facility] in [place]. Please Advise.

## 2021-11-14 NOTE — Progress Notes (Signed)
Diabetes Self-Management Education  Visit Type: First/Initial  Appt. Start Time: 0825 Appt. End Time: 0930  11/14/2021  [name], identified by name and date of birth, is a 31 y.o. female with a diagnosis of Diabetes: Gestational Diabetes.   ASSESSMENT  Blood pressure 92/60, height 5\' 3"  (1.6 m), weight 187 lb 3.2 oz (84.9 kg), last menstrual period 03/26/2021, estimated date of delivery 12/31/2021 Body mass index is 33.16 kg/m.   Diabetes Self-Management Education - 11/14/21 0947       Visit Information   Visit Type First/Initial      Initial Visit   Diabetes Type Gestational Diabetes    Are you currently following a meal plan? No    Are you taking your medications as prescribed? Yes    Date Diagnosed 2 weeks      Health Coping   How would you rate your overall health? Good      Psychosocial Assessment   Patient Belief/Attitude about Diabetes Other (comment)   "nervous"   Self-care barriers None    Self-management support Doctor's office;Family    Patient Concerns Nutrition/Meal planning;Glycemic Control;Weight Control    Special Needs None    Preferred Learning Style Visual;Other (comment)   talking/discussion   Learning Readiness Change in progress    How often do you need to have someone help you when you read instructions, pamphlets, or other written materials from your doctor or pharmacy? 1 - Never    What is the last grade level you completed in school? some college      Pre-Education Assessment   Patient understands the diabetes disease and treatment process. Needs Review    Patient understands incorporating nutritional management into lifestyle. Needs Review    Patient undertands incorporating physical activity into lifestyle. Needs Review    Patient understands using medications safely. Needs Instruction    Patient understands monitoring blood glucose, interpreting and using results Needs Review    Patient understands prevention, detection, and  treatment of acute complications. Needs Instruction    Patient understands prevention, detection, and treatment of chronic complications. Needs Instruction    Patient understands how to develop strategies to address psychosocial issues. Needs Review    Patient understands how to develop strategies to promote health/change behavior. Needs Review      Complications   How often do you check your blood sugar? 0 times/day (not testing)   Instructed patient on FreeStyle Freedom Lite meter. [employer] will no longer provided samples of their meters. Instructed pt to obtain prescription for meter, strips and lancets from MD. BG in the office on our meter was 89 mg/dL at 01/12/22 am - 2hrs pp.   Have you had a dilated eye exam in the past 12 months? No    Have you had a dental exam in the past 12 months? No    Are you checking your feet? No      Dietary Intake   Breakfast small banana, 1/2 apple, granola, Greek yogurt    Snack (morning) fruit, almond milk    Lunch small salad (lettuce, tomatoes, cuccumbers, dressing) with chicken or 0:93 and fruit (blueberries,banana, apple)    Snack (afternoon) peanut butter crackers    Dinner chicken, rice, pasta, peas, beans, corn, green beans, broccoli, brussel sprouts, cauliflower    Beverage(s) water, coffee, diet soda      Exercise   Exercise Type Light (walking / raking leaves)   yoga   How many days per week to you exercise? 4  How many minutes per day do you exercise? 40    Total minutes per week of exercise 160      Patient Education   Previous Diabetes Education Yes (please comment)   previous GDM 3 1/2 years ago   Disease state  Definition of diabetes, type 1 and 2, and the diagnosis of diabetes;Factors that contribute to the development of diabetes    Nutrition management  Role of diet in the treatment of diabetes and the relationship between the three main macronutrients and blood glucose level;Food label reading, portion sizes and measuring  food.;Reviewed blood glucose goals for pre and post meals and how to evaluate the patients' food intake on their blood glucose level.    Physical activity and exercise  Role of exercise on diabetes management, blood pressure control and cardiac health.    Medications Other (comment)   Limited use of oral medications and potential for insulin during pregnancy.   Monitoring Taught/evaluated SMBG meter.;Purpose and frequency of SMBG.;Taught/discussed recording of test results and interpretation of SMBG.;Identified appropriate SMBG and/or A1C goals.;Ketone testing, when, how.    Chronic complications Relationship between chronic complications and blood glucose control    Psychosocial adjustment Identified and addressed patients feelings and concerns about diabetes    Preconception care Pregnancy and GDM  Role of pre-pregnancy blood glucose control on the development of the fetus;Reviewed with patient blood glucose goals with pregnancy;Role of family planning for patients with diabetes      Individualized Goals (developed by patient)   Reducing Risk Other (comment)   improve blood sugars, lose weight     Outcomes   Expected Outcomes Demonstrated interest in learning. Expect positive outcomes        Individualized Plan for Diabetes Self-Management Training:   Learning Objective:  Patient will have a greater understanding of diabetes self-management. Patient education plan is to attend individual and/or group sessions per assessed needs and concerns.   Plan:   Patient Instructions  Read booklet on Gestational Diabetes Follow Gestational Meal Planning Guidelines Limit fruit at breakfast if blood sugars elevated Include 1 serving of protein with snacks (especially fruit) Complete a 3 Day Food Record and bring to next appointment Check blood sugars 4 x day - before breakfast and 2 hrs after every meal and record  Bring blood sugar log to all appointments Call MD for prescription for glucometer,  meter strips and lancets Strips   FreeStyle Lite  Lancets   FreeStyle Purchase urine ketone strips if instructed by MD and check urine ketones every am:  If + increase bedtime snack to 1 protein and 2 carbohydrate servings Walk 20-30 minutes at least 5 x week if permitted by MD  Expected Outcomes:  Demonstrated interest in learning. Expect positive outcomes  Education material provided:  Gestational Booklet Gestational Meal Planning Guidelines Simple Meal Plan 3 Day Food Record Goals for a Healthy Pregnancy   If problems or questions, patient to contact team via:   Sharion Settler, RN, CCM, CDCES [phone]  Future DSME appointment:  November 17, 2021 with the dietitian

## 2021-11-14 NOTE — Patient Instructions (Signed)
Read booklet on Gestational Diabetes Follow Gestational Meal Planning Guidelines Limit fruit at breakfast if blood sugars elevated Include 1 serving of protein with snacks (especially fruit) Complete a 3 Day Food Record and bring to next appointment Check blood sugars 4 x day - before breakfast and 2 hrs after every meal and record  Bring blood sugar log to all appointments Call MD for prescription for glucometer, meter strips and lancets Strips   FreeStyle Lite  Lancets   FreeStyle Purchase urine ketone strips if instructed by MD and check urine ketones every am:  If + increase bedtime snack to 1 protein and 2 carbohydrate servings Walk 20-30 minutes at least 5 x week if permitted by MD

## 2021-11-15 ENCOUNTER — Encounter: Payer: Self-pay | Admitting: Obstetrics and Gynecology

## 2021-11-15 ENCOUNTER — Telehealth: Payer: Self-pay | Admitting: Obstetrics and Gynecology

## 2021-11-15 NOTE — Telephone Encounter (Signed)
Pt is calling again to check the status of the below msg. And would like to have a call back.

## 2021-11-15 NOTE — Telephone Encounter (Signed)
Pt is calling in stating that she was told to go to the pharmacy to get a glucose meter, test strips and lancets by the dietician and they did not have any in stock.  Pt stated that her insurance will cover the Freestyle Lite and would like to see if Dr. Marcelline Mates would send in a script to the pharmacy.  Pharm:  [facility] in [place]

## 2021-11-16 MED ORDER — FREESTYLE LANCETS MISC: 6 refills | Status: AC

## 2021-11-16 MED ORDER — FREESTYLE LITE DEVI: 1.0000 | Freq: Once | 0 refills | Status: AC

## 2021-11-16 MED ORDER — FREESTYLE LITE TEST VI STRP: ORAL_STRIP | 6 refills | Status: AC

## 2021-11-16 NOTE — Telephone Encounter (Signed)
Pt is calling in again to see if she can get the below msg addressed so that she can share her results with the nutritionist of how her blood sugars are running.  Pt  would like to have the meter and supplies today if possible.

## 2021-11-16 NOTE — Telephone Encounter (Signed)
Please inform patient that her supplies have been sent in.

## 2021-11-17 ENCOUNTER — Other Ambulatory Visit: Payer: Self-pay

## 2021-11-17 ENCOUNTER — Encounter: Payer: Self-pay | Admitting: Dietician

## 2021-11-17 ENCOUNTER — Encounter: Admitting: Dietician

## 2021-11-17 VITALS — BP 106/72 | Ht 63.0 in | Wt 188.3 lb

## 2021-11-17 DIAGNOSIS — O24410 Gestational diabetes mellitus in pregnancy, diet controlled: Secondary | ICD-10-CM | POA: Diagnosis not present

## 2021-11-17 NOTE — Telephone Encounter (Signed)
Pt is aware and has picked up supplies

## 2021-11-17 NOTE — Progress Notes (Signed)
Patient has not yet been able to start testing BGs at home, as she had some difficulty obtaining the correct meter and supplies for her insurance coverage. She did get the correct meter shortly before this visit and will begin testing today. Encouraged her to call if any questions arise regarding BG testing or BG results. Reviewed goal ranges for fasting and and post-meal BGs. Patient's food diary indicates meals and snacks at regular intervals, appropriate nutritional balance with some room for increase in carb intake. She denies excess hunger, frequent hypoglycemia symptoms, and weight gain continues appropriately.   Provided basic balanced meal plan, and discussed meal and snack options based on patient's food preferences. She voices good understanding of meal planning process.  Instructed patient on food safety, including avoidance of Listeriosis, and limiting mercury from fish. Discussed importance of maintaining healthy lifestyle habits to reduce risk of Type 2 DM as well as Gestational DM with any future pregnancies. Advised patient to use any remaining testing supplies to test some BGs after delivery, and to have BG tested ideally annually, as well as prior to attempting future pregnancies.

## 2021-11-19 ENCOUNTER — Other Ambulatory Visit: Payer: Self-pay

## 2021-11-19 ENCOUNTER — Observation Stay
Admission: EM | Admit: 2021-11-19 | Discharge: 2021-11-19 | Disposition: A | Discharge status: Home / Self Care | Attending: Obstetrics and Gynecology | Admitting: Obstetrics and Gynecology

## 2021-11-19 ENCOUNTER — Encounter: Payer: Self-pay | Admitting: Obstetrics and Gynecology

## 2021-11-19 DIAGNOSIS — O26893 Other specified pregnancy related conditions, third trimester: Secondary | ICD-10-CM

## 2021-11-19 DIAGNOSIS — O368330 Maternal care for abnormalities of the fetal heart rate or rhythm, third trimester, not applicable or unspecified: Secondary | ICD-10-CM | POA: Diagnosis not present

## 2021-11-19 DIAGNOSIS — O4703 False labor before 37 completed weeks of gestation, third trimester: Secondary | ICD-10-CM | POA: Diagnosis present

## 2021-11-19 DIAGNOSIS — R109 Unspecified abdominal pain: Secondary | ICD-10-CM

## 2021-11-19 DIAGNOSIS — Z3A34 34 weeks gestation of pregnancy: Secondary | ICD-10-CM | POA: Insufficient documentation

## 2021-11-19 DIAGNOSIS — O09213 Supervision of pregnancy with history of pre-term labor, third trimester: Secondary | ICD-10-CM

## 2021-11-19 LAB — RUPTURE OF MEMBRANE (ROM)PLUS: Rom Plus: NEGATIVE

## 2021-11-19 MED ORDER — TERBUTALINE SULFATE 1 MG/ML IJ SOLN
0.2500 mg | Freq: Once | INTRAMUSCULAR | Status: AC
  Administered 2021-11-19: 0.25 mg via SUBCUTANEOUS
  Filled 2021-11-19: qty 1

## 2021-11-19 MED ORDER — NIFEDIPINE 10 MG PO CAPS
10.0000 mg | ORAL_CAPSULE | Freq: Once | ORAL | Status: AC
  Administered 2021-11-19: 10 mg via ORAL
  Filled 2021-11-19: qty 1

## 2021-11-19 MED ORDER — BETAMETHASONE SOD PHOS & ACET 6 (3-3) MG/ML IJ SUSP
12.0000 mg | INTRAMUSCULAR | Status: DC
  Administered 2021-11-19: 12 mg via INTRAMUSCULAR
  Filled 2021-11-19: qty 5

## 2021-11-19 MED ORDER — NIFEDIPINE 10 MG PO CAPS
10.0000 mg | ORAL_CAPSULE | Freq: Once | ORAL | Status: DC
Start: 2021-11-19 — End: 2021-11-20
  Filled 2021-11-19: qty 1

## 2021-11-19 MED ORDER — NIFEDIPINE 10 MG PO CAPS
30.0000 mg | ORAL_CAPSULE | Freq: Once | ORAL | Status: DC
  Filled 2021-11-19: qty 3

## 2021-11-19 NOTE — Progress Notes (Signed)
Pt discharged home. Labor precautions reviewed. Procardia to be taken @ 2200 tonight. Second dose of betamethasone scheduled for 1400 tomorrow. Follow up appointment already scheduled for this Wednesday. Left floor ambulatory with husband. Elaina Hoops

## 2021-11-19 NOTE — OB Triage Note (Signed)
Pt states she thinks her water broke @ 1015 this am. Reports ctx since 8pm last night. Also reports bloody/pinkish vaginal discharge. Audible fetal movement. Husband @ bedside. Elaina Hoops

## 2021-11-20 ENCOUNTER — Inpatient Hospital Stay
Admission: RE | Admit: 2021-11-20 | Discharge: 2021-11-20 | Disposition: A | Discharge status: Home / Self Care | Attending: Obstetrics and Gynecology | Admitting: Obstetrics and Gynecology

## 2021-11-20 DIAGNOSIS — O479 False labor, unspecified: Secondary | ICD-10-CM | POA: Insufficient documentation

## 2021-11-20 MED ORDER — BETAMETHASONE SOD PHOS & ACET 6 (3-3) MG/ML IJ SUSP
12.0000 mg | Freq: Once | INTRAMUSCULAR | Status: AC
  Administered 2021-11-20: 12 mg via INTRAMUSCULAR

## 2021-11-22 ENCOUNTER — Ambulatory Visit (INDEPENDENT_AMBULATORY_CARE_PROVIDER_SITE_OTHER): Admitting: Obstetrics and Gynecology

## 2021-11-22 ENCOUNTER — Encounter: Payer: Self-pay | Admitting: Obstetrics and Gynecology

## 2021-11-22 ENCOUNTER — Other Ambulatory Visit: Payer: Self-pay

## 2021-11-22 VITALS — BP 108/70 | HR 72 | Wt 185.0 lb

## 2021-11-22 DIAGNOSIS — Z3A34 34 weeks gestation of pregnancy: Secondary | ICD-10-CM

## 2021-11-22 DIAGNOSIS — Z3483 Encounter for supervision of other normal pregnancy, third trimester: Secondary | ICD-10-CM

## 2021-11-22 LAB — POCT URINALYSIS DIPSTICK OB
Bilirubin, UA: NEGATIVE
Blood, UA: NEGATIVE
Glucose, UA: NEGATIVE
Ketones, UA: NEGATIVE
Leukocytes, UA: NEGATIVE
Nitrite, UA: NEGATIVE
Spec Grav, UA: 1.015 (ref 1.010–1.025)
Urobilinogen, UA: 0.2 E.U./dL
pH, UA: 7 (ref 5.0–8.0)

## 2021-11-22 NOTE — Discharge Summary (Signed)
° ° °  L&D OB Triage Note  SUBJECTIVE Kayla Reid is a 31 y.o. [phone] female at [redacted]w[redacted]d, EDD Estimated Date of Delivery: 12/31/21 who presented to triage with complaints of irregular contractions and possible spontaneous rupture of membranes.  She noticed some pink discharge.  Reports fetal movement.  OB History  Gravida Para Term Preterm AB Living  4 3 2 1  0 3  SAB IAB Ectopic Multiple Live Births  0 0 0 0 3    # Outcome Date GA Lbr Len/2nd Weight Sex Delivery Anes PTL Lv  4 Current           3 Preterm 05/08/18 [redacted]w[redacted]d  3062 g M Vag-Spont  N LIV  2 Term 02/27/14 [redacted]w[redacted]d  3657 g F Vag-Spont  N LIV  1 Term 01/30/12 [redacted]w[redacted]d  3062 g F Vag-Spont   LIV    No medications prior to admission.     OBJECTIVE  Nursing Evaluation:   BP 119/74    Pulse 87    Temp 98.3 F (36.8 C) (Oral)    Resp 16    Ht 5\' 3"  (1.6 m)    Wt 84.8 kg    LMP 03/26/2021    BMI 33.13 kg/m    Findings:        ROM plus negative -no evidence of ROM.     Irregular contractions -terbutaline given -dose of Procardia given at discharge.     Contractions became less severe and frequent after tocolytics.      NST was performed and has been reviewed by me.  NST INTERPRETATION: Category I  Mode: External Baseline Rate (A): 155 bpm Variability: Minimal, Moderate Accelerations: 15 x 15 Decelerations: None     Contraction Frequency (min): occ ctx with ui  ASSESSMENT Impression:  1.  Pregnancy:  at [redacted]w[redacted]d , EDD Estimated Date of Delivery: 12/31/21 2.  Reassuring fetal and maternal status 3.  Irregular preterm contractions -no evidence of ROM  PLAN 1. Current condition and above findings reviewed.  Reassuring fetal and maternal condition. 2. Discharge home with standard labor precautions given to return to L&D or call the office for problems. 3.  Betamethasone given, second dose planned tomorrow. 4.  Patient to return if contractions continue or worsen.

## 2021-11-22 NOTE — Progress Notes (Signed)
ROB: She is doing well, has no new concerns today.

## 2021-11-22 NOTE — Progress Notes (Signed)
ROB: Has contractions every day but nothing regular.  Received betamethasone for possible early delivery.  Patient keeping sugars and her sugar log shows her sugars to be well controlled.  Preterm labor discussed.  Cultures next visit.  Begin NSTs next visit for both preeclampsia and gestational diabetes.

## 2021-11-28 ENCOUNTER — Telehealth: Payer: Self-pay

## 2021-11-28 NOTE — Telephone Encounter (Signed)
Patient called. She said you told her to contact office if she continues to have symptoms. She reports that her blood pressure keep going up and down. 150/92 - 115/70. She is also having headaches and dizziness. Please advise.  CB

## 2021-12-06 ENCOUNTER — Other Ambulatory Visit: Payer: Self-pay

## 2021-12-06 ENCOUNTER — Other Ambulatory Visit

## 2021-12-06 ENCOUNTER — Encounter: Payer: Self-pay | Admitting: Obstetrics and Gynecology

## 2021-12-06 ENCOUNTER — Ambulatory Visit (INDEPENDENT_AMBULATORY_CARE_PROVIDER_SITE_OTHER): Admitting: Obstetrics and Gynecology

## 2021-12-06 VITALS — BP 120/71 | HR 83 | Wt 187.0 lb

## 2021-12-06 DIAGNOSIS — Z3685 Encounter for antenatal screening for Streptococcus B: Secondary | ICD-10-CM

## 2021-12-06 DIAGNOSIS — Z113 Encounter for screening for infections with a predominantly sexual mode of transmission: Secondary | ICD-10-CM

## 2021-12-06 DIAGNOSIS — Z3483 Encounter for supervision of other normal pregnancy, third trimester: Secondary | ICD-10-CM

## 2021-12-06 DIAGNOSIS — Z3A36 36 weeks gestation of pregnancy: Secondary | ICD-10-CM

## 2021-12-06 DIAGNOSIS — O24410 Gestational diabetes mellitus in pregnancy, diet controlled: Secondary | ICD-10-CM | POA: Diagnosis not present

## 2021-12-06 DIAGNOSIS — R42 Dizziness and giddiness: Secondary | ICD-10-CM

## 2021-12-06 LAB — POCT URINALYSIS DIPSTICK OB
Bilirubin, UA: NEGATIVE
Glucose, UA: NEGATIVE
Ketones, UA: NEGATIVE
Leukocytes, UA: NEGATIVE
Nitrite, UA: NEGATIVE
Spec Grav, UA: 1.015 (ref 1.010–1.025)
Urobilinogen, UA: 0.2 E.U./dL
pH, UA: 7 (ref 5.0–8.0)

## 2021-12-06 NOTE — Progress Notes (Signed)
ROB: She is doing well. She continues to have some dizziness, she has increased her water intake with no relief.  NST, GBS, G/C done today.

## 2021-12-06 NOTE — Progress Notes (Signed)
ROB: Reports some dizziness, despite increasing water intake. Discussed other causes of dizziness, advised on changing positions slowly.  Is noting feeling her contractions, began yesterday, slowed down overnight but began again this morning, more regular. Discussed PTL precautions. Blood sugars still wnl. 36 week cultures performed. NST performed today was reviewed and was found to be reactive.  Continue recommended antenatal testing and prenatal care.   NONSTRESS TEST INTERPRETATION  INDICATIONS:  Gestational DM (diet)  FHR baseline: 1 RESULTS:Reactive COMMENTS: Contractions q 1-3 minutes   PLAN: 1. Continue fetal kick counts twice a day. 2. Continue antepartum testing weekly.  3. Preterm labor precautions given.

## 2021-12-08 LAB — STREP GP B NAA: Strep Gp B NAA: NEGATIVE

## 2021-12-08 LAB — GC/CHLAMYDIA PROBE AMP
Chlamydia trachomatis, NAA: NEGATIVE
Neisseria Gonorrhoeae by PCR: NEGATIVE

## 2021-12-09 ENCOUNTER — Other Ambulatory Visit: Payer: Self-pay | Admitting: Obstetrics and Gynecology

## 2021-12-09 ENCOUNTER — Other Ambulatory Visit: Payer: Self-pay

## 2021-12-09 ENCOUNTER — Encounter: Payer: Self-pay | Admitting: Obstetrics and Gynecology

## 2021-12-09 ENCOUNTER — Observation Stay
Admission: EM | Admit: 2021-12-09 | Discharge: 2021-12-09 | Disposition: A | Discharge status: Home / Self Care | Attending: Obstetrics and Gynecology | Admitting: Obstetrics and Gynecology

## 2021-12-09 DIAGNOSIS — R109 Unspecified abdominal pain: Secondary | ICD-10-CM

## 2021-12-09 DIAGNOSIS — Z3A36 36 weeks gestation of pregnancy: Secondary | ICD-10-CM

## 2021-12-09 DIAGNOSIS — O4703 False labor before 37 completed weeks of gestation, third trimester: Principal | ICD-10-CM | POA: Insufficient documentation

## 2021-12-09 DIAGNOSIS — O26893 Other specified pregnancy related conditions, third trimester: Secondary | ICD-10-CM

## 2021-12-09 DIAGNOSIS — Z3A37 37 weeks gestation of pregnancy: Secondary | ICD-10-CM | POA: Diagnosis not present

## 2021-12-09 MED ORDER — MORPHINE SULFATE (PF) 10 MG/ML IV SOLN
10.0000 mg | Freq: Once | INTRAVENOUS | Status: AC
  Administered 2021-12-09: 10 mg via INTRAMUSCULAR
  Filled 2021-12-09: qty 1

## 2021-12-09 MED ORDER — PROMETHAZINE HCL 25 MG/ML IJ SOLN
25.0000 mg | Freq: Once | INTRAMUSCULAR | Status: AC
Start: 2021-12-09 — End: 2021-12-09
  Administered 2021-12-09: 25 mg via INTRAMUSCULAR
  Filled 2021-12-09: qty 1

## 2021-12-09 NOTE — OB Triage Note (Signed)
Pt. Co ctx "a few days ago" becoming more frequent around 1500 today. Pt reports pink discharge. Denies leaking of fluid. Reports + fetal movement. Kayla Reid

## 2021-12-09 NOTE — Final Progress Note (Signed)
L&D OB Triage Note  Kayla Reid is a 31 y.o. [phone] female at [redacted]w[redacted]d, EDD Estimated Date of Delivery: 12/31/21 who presented to triage for complaints of contractions. Notes that she began having contractions a few days ago, but became more frequent around 3:00 PM today.  Noting pink discharge but denies LOF or heavy vaginal bleeding. Reports good fetal movement.  She was evaluated by the nurses with no significant findings for active labor (no cervical change after 2.5 hour check and ambulation). Vital signs stable. An NST was performed and has been reviewed by MD. She was treated with PO hydration, IV Morphine rest.    Physical Exam:  Blood pressure 128/72, pulse 85, temperature 98.5 F (36.9 C), temperature source Oral, resp. rate 16, height 5\' 3"  (1.6 m), weight 84.8 kg, last menstrual period 03/26/2021. Dilation: 5 Effacement (%): 80 Cervical Position: Anterior Station: -3 Presentation: Vertex Exam by:: A. White, RN   NST INTERPRETATION: Indications: rule out uterine contractions  Mode: External Baseline Rate (A): 150 bpm Variability: Moderate Accelerations: 15 x 15 Decelerations: None     Contraction Frequency (min): 1.5-5  Impression: reactive   Plan: NST performed was reviewed and was found to be reactive. She was discharged home with bleeding/labor precautions.  Continue routine prenatal care. Follow up with OB/GYN as previously scheduled.     Rubie Maid, MD [facility]

## 2021-12-11 ENCOUNTER — Other Ambulatory Visit: Payer: Self-pay

## 2021-12-11 ENCOUNTER — Encounter: Payer: Self-pay | Admitting: Obstetrics and Gynecology

## 2021-12-11 ENCOUNTER — Ambulatory Visit (INDEPENDENT_AMBULATORY_CARE_PROVIDER_SITE_OTHER): Admitting: Obstetrics and Gynecology

## 2021-12-11 DIAGNOSIS — Z3A37 37 weeks gestation of pregnancy: Secondary | ICD-10-CM

## 2021-12-11 DIAGNOSIS — Z3483 Encounter for supervision of other normal pregnancy, third trimester: Secondary | ICD-10-CM

## 2021-12-11 LAB — POCT URINALYSIS DIPSTICK OB
Bilirubin, UA: NEGATIVE
Blood, UA: NEGATIVE
Glucose, UA: NEGATIVE
Ketones, UA: NEGATIVE
Nitrite, UA: NEGATIVE
Spec Grav, UA: 1.015 (ref 1.010–1.025)
Urobilinogen, UA: 4 E.U./dL — AB
pH, UA: 7 (ref 5.0–8.0)

## 2021-12-11 NOTE — Progress Notes (Signed)
ROB: She is having contraction 3-4 minutes apart with light pink spotting.

## 2021-12-11 NOTE — Progress Notes (Signed)
ROB: Patient states she continues to have irregular contractions- "pretty much continuous".  They have not gotten any stronger since the weekend.  Signs and symptoms of labor discussed.  Patient advised to go in if symptoms or contractions become any stronger.  She reports daily fetal movement.

## 2021-12-20 ENCOUNTER — Encounter: Payer: Self-pay | Admitting: Obstetrics and Gynecology

## 2021-12-20 ENCOUNTER — Encounter: Admitting: Obstetrics and Gynecology

## 2021-12-20 ENCOUNTER — Ambulatory Visit (INDEPENDENT_AMBULATORY_CARE_PROVIDER_SITE_OTHER): Admitting: Obstetrics and Gynecology

## 2021-12-20 ENCOUNTER — Inpatient Hospital Stay
Admission: EM | Admit: 2021-12-20 | Discharge: 2021-12-21 | DRG: 807 | Disposition: A | Discharge status: Home / Self Care | Attending: Obstetrics and Gynecology | Admitting: Obstetrics and Gynecology

## 2021-12-20 ENCOUNTER — Other Ambulatory Visit: Payer: Self-pay

## 2021-12-20 VITALS — BP 129/84 | HR 85 | Wt 192.0 lb

## 2021-12-20 DIAGNOSIS — O26893 Other specified pregnancy related conditions, third trimester: Secondary | ICD-10-CM | POA: Diagnosis present

## 2021-12-20 DIAGNOSIS — O24420 Gestational diabetes mellitus in childbirth, diet controlled: Principal | ICD-10-CM | POA: Diagnosis present

## 2021-12-20 DIAGNOSIS — Z20822 Contact with and (suspected) exposure to covid-19: Secondary | ICD-10-CM | POA: Diagnosis present

## 2021-12-20 DIAGNOSIS — Z3483 Encounter for supervision of other normal pregnancy, third trimester: Secondary | ICD-10-CM

## 2021-12-20 DIAGNOSIS — Z3A38 38 weeks gestation of pregnancy: Secondary | ICD-10-CM

## 2021-12-20 DIAGNOSIS — O09293 Supervision of pregnancy with other poor reproductive or obstetric history, third trimester: Secondary | ICD-10-CM | POA: Diagnosis not present

## 2021-12-20 DIAGNOSIS — O24410 Gestational diabetes mellitus in pregnancy, diet controlled: Secondary | ICD-10-CM | POA: Diagnosis not present

## 2021-12-20 LAB — POCT URINALYSIS DIPSTICK OB
Bilirubin, UA: NEGATIVE
Blood, UA: NEGATIVE
Glucose, UA: NEGATIVE
Ketones, UA: NEGATIVE
Leukocytes, UA: NEGATIVE
Nitrite, UA: NEGATIVE
Spec Grav, UA: 1.015 (ref 1.010–1.025)
Urobilinogen, UA: 2 E.U./dL — AB
pH, UA: 7 (ref 5.0–8.0)

## 2021-12-20 LAB — ABO/RH: ABO/RH(D): A POS

## 2021-12-20 LAB — RESP PANEL BY RT-PCR (FLU A&B, COVID) ARPGX2
Influenza A by PCR: NEGATIVE
Influenza B by PCR: NEGATIVE
SARS Coronavirus 2 by RT PCR: NEGATIVE

## 2021-12-20 LAB — CBC
HCT: 31.5 % — ABNORMAL LOW (ref 36.0–46.0)
Hemoglobin: 10.4 g/dL — ABNORMAL LOW (ref 12.0–15.0)
MCH: 28 pg (ref 26.0–34.0)
MCHC: 33 g/dL (ref 30.0–36.0)
MCV: 84.9 fL (ref 80.0–100.0)
Platelets: 140 10*3/uL — ABNORMAL LOW (ref 150–400)
RBC: 3.71 MIL/uL — ABNORMAL LOW (ref 3.87–5.11)
RDW: 13.9 % (ref 11.5–15.5)
WBC: 7.3 10*3/uL (ref 4.0–10.5)
nRBC: 0 % (ref 0.0–0.2)

## 2021-12-20 MED ORDER — PRENATAL MULTIVITAMIN CH
1.0000 | ORAL_TABLET | Freq: Every day | ORAL | Status: DC
  Administered 2021-12-21: 1 via ORAL
  Filled 2021-12-20: qty 1

## 2021-12-20 MED ORDER — TETANUS-DIPHTH-ACELL PERTUSSIS 5-2.5-18.5 LF-MCG/0.5 IM SUSY
0.5000 mL | PREFILLED_SYRINGE | Freq: Once | INTRAMUSCULAR | Status: DC
  Filled 2021-12-20: qty 0.5

## 2021-12-20 MED ORDER — METHYLERGONOVINE MALEATE 0.2 MG/ML IJ SOLN
INTRAMUSCULAR | Status: AC
  Filled 2021-12-20: qty 1

## 2021-12-20 MED ORDER — ONDANSETRON HCL 4 MG/2ML IJ SOLN: 4.0000 mg | Freq: Four times a day (QID) | INTRAMUSCULAR | Status: DC | PRN

## 2021-12-20 MED ORDER — OXYTOCIN-SODIUM CHLORIDE 30-0.9 UT/500ML-% IV SOLN: 1.0000 m[IU]/min | INTRAVENOUS | Status: DC

## 2021-12-20 MED ORDER — TERBUTALINE SULFATE 1 MG/ML IJ SOLN: 0.2500 mg | Freq: Once | INTRAMUSCULAR | Status: DC | PRN

## 2021-12-20 MED ORDER — TRANEXAMIC ACID-NACL 1000-0.7 MG/100ML-% IV SOLN: 1000.0000 mg | Freq: Once | INTRAVENOUS | Status: DC

## 2021-12-20 MED ORDER — LIDOCAINE HCL (PF) 1 % IJ SOLN: 30.0000 mL | INTRAMUSCULAR | Status: DC | PRN

## 2021-12-20 MED ORDER — LACTATED RINGERS IV SOLN: INTRAVENOUS | Status: DC

## 2021-12-20 MED ORDER — SIMETHICONE 80 MG PO CHEW: 80.0000 mg | CHEWABLE_TABLET | ORAL | Status: DC | PRN

## 2021-12-20 MED ORDER — BUTORPHANOL TARTRATE 1 MG/ML IJ SOLN: 1.0000 mg | INTRAMUSCULAR | Status: DC | PRN

## 2021-12-20 MED ORDER — DIPHENHYDRAMINE HCL 25 MG PO CAPS: 25.0000 mg | ORAL_CAPSULE | Freq: Four times a day (QID) | ORAL | Status: DC | PRN

## 2021-12-20 MED ORDER — OXYCODONE-ACETAMINOPHEN 5-325 MG PO TABS: 1.0000 | ORAL_TABLET | ORAL | Status: DC | PRN

## 2021-12-20 MED ORDER — SOD CITRATE-CITRIC ACID 500-334 MG/5ML PO SOLN: 30.0000 mL | ORAL | Status: DC | PRN

## 2021-12-20 MED ORDER — ACETAMINOPHEN 325 MG PO TABS: 650.0000 mg | ORAL_TABLET | ORAL | Status: DC | PRN

## 2021-12-20 MED ORDER — OXYTOCIN BOLUS FROM INFUSION
333.0000 mL | Freq: Once | INTRAVENOUS | Status: AC
  Administered 2021-12-20: 333 mL via INTRAVENOUS

## 2021-12-20 MED ORDER — METHYLERGONOVINE MALEATE 0.2 MG/ML IJ SOLN: 0.2000 mg | Freq: Once | INTRAMUSCULAR | Status: DC

## 2021-12-20 MED ORDER — OXYTOCIN-SODIUM CHLORIDE 30-0.9 UT/500ML-% IV SOLN: 2.5000 [IU]/h | INTRAVENOUS | Status: DC

## 2021-12-20 MED ORDER — IBUPROFEN 600 MG PO TABS
600.0000 mg | ORAL_TABLET | Freq: Four times a day (QID) | ORAL | Status: DC
  Administered 2021-12-20 – 2021-12-21 (×4): 600 mg via ORAL
  Filled 2021-12-20 (×4): qty 1

## 2021-12-20 MED ORDER — ACETAMINOPHEN 325 MG PO TABS
650.0000 mg | ORAL_TABLET | ORAL | Status: DC | PRN
  Administered 2021-12-20 – 2021-12-21 (×2): 650 mg via ORAL
  Filled 2021-12-20 (×2): qty 2

## 2021-12-20 MED ORDER — BENZOCAINE-MENTHOL 20-0.5 % EX AERO
1.0000 "application " | INHALATION_SPRAY | CUTANEOUS | Status: DC | PRN
  Filled 2021-12-20: qty 56

## 2021-12-20 MED ORDER — OXYTOCIN-SODIUM CHLORIDE 30-0.9 UT/500ML-% IV SOLN
2.5000 [IU]/h | INTRAVENOUS | Status: DC | PRN
  Administered 2021-12-20: 2.5 [IU]/h via INTRAVENOUS
  Filled 2021-12-20: qty 500

## 2021-12-20 MED ORDER — OXYCODONE-ACETAMINOPHEN 5-325 MG PO TABS: 2.0000 | ORAL_TABLET | ORAL | Status: DC | PRN

## 2021-12-20 MED ORDER — LACTATED RINGERS IV SOLN: 500.0000 mL | INTRAVENOUS | Status: DC | PRN

## 2021-12-20 MED ORDER — TRANEXAMIC ACID-NACL 1000-0.7 MG/100ML-% IV SOLN
INTRAVENOUS | Status: AC
  Administered 2021-12-20: 1000 mg
  Filled 2021-12-20: qty 100

## 2021-12-20 MED ORDER — DOCUSATE SODIUM 100 MG PO CAPS
100.0000 mg | ORAL_CAPSULE | Freq: Two times a day (BID) | ORAL | Status: DC
  Administered 2021-12-21: 100 mg via ORAL
  Filled 2021-12-20 (×2): qty 1

## 2021-12-20 MED ORDER — ZOLPIDEM TARTRATE 5 MG PO TABS: 5.0000 mg | ORAL_TABLET | Freq: Every evening | ORAL | Status: DC | PRN

## 2021-12-20 NOTE — OB Triage Note (Signed)
Patient is a [id number] at [redacted]w[redacted]d who presents to unit c/o LOF and ctx that began around 1500.

## 2021-12-20 NOTE — H&P (Signed)
History and Physical   HPI  Kayla Reid is a 31 y.o. [id number] at [redacted]w[redacted]d Estimated Date of Delivery: 12/31/21 who is being admitted for labor management.     OB History  OB History  Gravida Para Term Preterm AB Living  4 3 2 1  0 3  SAB IAB Ectopic Multiple Live Births  0 0 0 0 3    # Outcome Date GA Lbr Len/2nd Weight Sex Delivery Anes PTL Lv  4 Current           3 Preterm 05/08/18 [redacted]w[redacted]d  3062 g M Vag-Spont  N LIV  2 Term 02/27/14 [redacted]w[redacted]d  3657 g F Vag-Spont  N LIV  1 Term 01/30/12 [redacted]w[redacted]d  3062 g F Vag-Spont   LIV    PROBLEM LIST  Pregnancy complications or risks: Patient Active Problem List   Diagnosis Date Noted   Labor and delivery, indication for care 12/09/2021   Indication for care in labor or delivery 11/19/2021   History of gestational diabetes 11/01/2021   Diet controlled gestational diabetes mellitus (GDM) in third trimester 11/01/2021   Preterm uterine contractions in third trimester, antepartum 10/14/2021   Pregnancy 10/14/2021   History of preterm delivery 09/22/2021   Current pregnancy with history of pre-term labor in third trimester 07/20/2021   Red blood cell antibody positive 07/20/2021   History of pre-eclampsia in prior pregnancy, currently pregnant in second trimester 07/20/2021    Prenatal labs and studies: ABO, Rh: A/Positive/-- (07/22 [id number]) Antibody: Positive, See Final Results (07/22 0952) Rubella: 5.21 (07/22 0952) RPR: Non Reactive (12/01 1056)  HBsAg: Negative (07/22 0952)  HIV: Non Reactive (07/22 0952)  [id number] (01/25 1411)   Past Medical History:  Diagnosis Date   Gestational diabetes    Medical history non-contributory      Past Surgical History:  Procedure Laterality Date   NO PAST SURGERIES       Medications    Current Discharge Medication List     CONTINUE these medications which have NOT CHANGED   Details  BABY ASPIRIN PO Take 81 mg by mouth daily.    Prenatal Vit-Fe Fumarate-FA (PRENATAL PO)  Take 1 tablet by mouth daily.    glucose blood (FREESTYLE LITE) test strip Use as instructed Qty: 100 each, Refills: 6    Lancets (FREESTYLE) lancets Use as instructed Qty: 100 each, Refills: 6         Allergies  Patient has no known allergies.  Review of Systems  Pertinent items noted in HPI and remainder of comprehensive ROS otherwise negative.  Physical Exam  BP (!) 127/92    Pulse 88    Temp 98.8 F (37.1 C) (Oral)    Resp 18    Ht 5\' 3"  (1.6 m)    Wt 87.1 kg    LMP 03/26/2021    BMI 34.01 kg/m   Lungs:  CTA B Cardio: RRR without M/R/G Abd: Soft, gravid, NT Presentation: cephalic EXT: No C/C/ 1+ Edema DTRs: 2+ B CERVIX: Dilation: Lip/rim Effacement (%): 100 Presentation: Vertex Exam by:: Montreal Steidle MD  See Prenatal records for more detailed PE. AROM- clear    FHR:  Variability: Good {> 6 bpm)  Toco: Uterine Contractions: Frequency: Every 3 minutes  Test Results  Results for orders placed or performed during the hospital encounter of 12/20/21 (from the past 24 hour(s))  CBC     Status: Abnormal   Collection Time: 12/20/21  5:19 PM  Result Value Ref Range   WBC  7.3 4.0 - 10.5 K/uL   RBC 3.71 (L) 3.87 - 5.11 MIL/uL   Hemoglobin 10.4 (L) 12.0 - 15.0 g/dL   HCT 31.5 (L) 36.0 - 46.0 %   MCV 84.9 80.0 - 100.0 fL   MCH 28.0 26.0 - 34.0 pg   MCHC 33.0 30.0 - 36.0 g/dL   RDW 13.9 11.5 - 15.5 %   Platelets 140 (L) 150 - 400 K/uL   nRBC 0.0 0.0 - 0.2 %   Group B Strep negative  Assessment   [id number] at [redacted]w[redacted]d Estimated Date of Delivery: 12/31/21  The fetus is reassuring.   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 Admit to L&D :   2. EFM: -- Category 1 3. Stadol or Epidural if desired.   4. Admission labs  5. Expect vaginal delivery  Finis Bud, M.D. 12/20/2021 5:36 PM

## 2021-12-20 NOTE — Progress Notes (Signed)
ROB: Notes still having mild but regular contractions.  Advanced dilation since ~ 36 weeks, currently at 5 cm. Discussed labor precautions. RTC in 1 week if undelivered.

## 2021-12-21 DIAGNOSIS — O24410 Gestational diabetes mellitus in pregnancy, diet controlled: Secondary | ICD-10-CM

## 2021-12-21 DIAGNOSIS — O09293 Supervision of pregnancy with other poor reproductive or obstetric history, third trimester: Secondary | ICD-10-CM

## 2021-12-21 DIAGNOSIS — Z3A38 38 weeks gestation of pregnancy: Secondary | ICD-10-CM

## 2021-12-21 LAB — BPAM RBC
Blood Product Expiration Date: 202303042359
Blood Product Expiration Date: 202303042359
Unit Type and Rh: 6200
Unit Type and Rh: 6200

## 2021-12-21 LAB — RPR: RPR Ser Ql: NONREACTIVE

## 2021-12-21 LAB — TYPE AND SCREEN
ABO/RH(D): A POS
Antibody Screen: POSITIVE
PT AG Type: POSITIVE
Unit division: 0
Unit division: 0

## 2021-12-21 MED ORDER — IBUPROFEN 600 MG PO TABS: 600.0000 mg | ORAL_TABLET | Freq: Four times a day (QID) | ORAL | 0 refills | Status: AC

## 2021-12-21 NOTE — Discharge Instructions (Signed)

## 2021-12-21 NOTE — Discharge Summary (Signed)
Patient Name: Kayla Reid DOB: 04/25/1991 MRN: [id number]                            Discharge Summary  Date of Admission: 12/20/2021 Date of Discharge: 12/21/2021 Delivering Provider: Harlin Heys   Admitting Diagnosis: Indication for care in labor or delivery [O75.9] at [redacted]w[redacted]d Secondary diagnosis:  Principal Problem:   Indication for care in labor or delivery   Active labor Mode of Delivery: normal spontaneous vaginal delivery              Discharge diagnosis: Term Pregnancy Delivered and GDM A1      Intrapartum Procedures: Atificial rupture of membranes   Post partum procedures:  Use of Methergine and TXA  Complications: Patient had more bleeding than expected immediately postpartum from uterine atony.  This was quickly resolved with fundal massage Methergine and TXA.                     Discharge Day SOAP Note:  Progress Note - Vaginal Delivery  Nakishia Lacsamana is a 31 y.o. [phone] now PP day 1 s/p Vaginal, Spontaneous . Delivery was uncomplicated  Subjective  The patient has the following complaints: has no unusual complaints  Pain is controlled with current medications.   Patient is urinating without difficulty.  She is ambulating well.    She has no lightheadedness when out of bed.  Objective  Vital signs: BP 126/87 (BP Location: Left Arm)    Pulse 71    Temp 98.2 F (36.8 C) (Oral)    Resp 18    Ht 5\' 3"  (1.6 m)    Wt 87.1 kg    LMP 03/26/2021    SpO2 99%    Breastfeeding Unknown    BMI 34.01 kg/m   Physical Exam: Gen: NAD Fundus Fundal Tone: Firm  Lochia Amount: Small        Data Review Labs: Lab Results  Component Value Date   WBC 7.3 12/20/2021   HGB 10.4 (L) 12/20/2021   HCT 31.5 (L) 12/20/2021   MCV 84.9 12/20/2021   PLT 140 (L) 12/20/2021   CBC Latest Ref Rng & Units 12/20/2021 10/12/2021  WBC 4.0 - 10.5 K/uL 7.3 7.1  Hemoglobin 12.0 - 15.0 g/dL 10.4(L) 11.0(L)  Hematocrit 36.0 - 46.0 % 31.5(L) 31.3(L)  Platelets 150 - 400 K/uL  140(L) 146(L)   A POS Performed at [facility], [address] [place], [place] [address]   Flavia Shipper Score: Flavia Shipper Postnatal Depression Scale Screening Tool 12/20/2021  I have been able to laugh and see the funny side of things. (No Data)    Assessment/Plan  Principal Problem:   Indication for care in labor or delivery    Plan for discharge today.  Discharge Instructions: Per After Visit Summary. Activity: Advance as tolerated. Pelvic rest for 6 weeks.  Also refer to After Visit Summary Diet: Regular Medications: Allergies as of 12/21/2021   No Known Allergies      Medication List     STOP taking these medications    BABY ASPIRIN PO       TAKE these medications    freestyle lancets Use as instructed   FREESTYLE LITE test strip Generic drug: glucose blood Use as instructed   ibuprofen 600 MG tablet Commonly known as: ADVIL Take 1 tablet (600 mg total) by mouth every 6 (six) hours.   PRENATAL PO Take 1 tablet  by mouth daily.       Outpatient follow up:   Follow-up Information     Harlin Heys, MD. Schedule an appointment as soon as possible for a visit in 2 week(s).   Specialties: Obstetrics and Gynecology, Radiology Why: May do video visit if desired Contact information: [address] [place] [address] [phone]                Postpartum contraception: Will discuss at first office visit post-partum  Discharged Condition: good  Discharged to: home  Newborn Data: Disposition:home with mother  Apgars: APGAR (1 MIN): 7   APGAR (5 MINS): 9   APGAR (10 MINS):    Baby Feeding: Breast    Finis Bud, M.D. 12/21/2021 8:24 AM

## 2021-12-21 NOTE — Lactation Note (Signed)
This note was copied from a baby's chart. Lactation Consultation Note  Patient Name: Kayla Reid [id number] Date: 12/21/2021 Reason for consult: Initial assessment;Term Age:32 hours  Initial lactation visit. Mom is P4, but attempting to breastfeed with this baby. Mom reports attempting with other children but had difficulties and did not continue beyond the hospital.  Baby has feedings documented and voids. Mom last fed 3 hours ago and attempting to feed upon entry. Baby was sleepy, relaxed body language, open hands, not desiring to feed. LC provided reassurance and education of watching baby for early cues and not the clock, feeding on demand, skin to skin.  Maternal Data Has patient been taught Hand Expression?: Yes Does the patient have breastfeeding experience prior to this delivery?: Yes How long did the patient breastfeed?: limited  Feeding Mother's Current Feeding Choice: Breast Milk  LATCH Score                    Lactation Tools Discussed/Used    Interventions Interventions: Breast feeding basics reviewed;Hand express;Education  Discharge    Consult Status Consult Status: Follow-up Date: 12/21/21 Follow-up type: In-patient    Kayla Reid 12/21/2021, 12:19 PM

## 2021-12-27 ENCOUNTER — Encounter: Admitting: Obstetrics and Gynecology

## 2022-01-05 ENCOUNTER — Other Ambulatory Visit

## 2022-01-05 ENCOUNTER — Encounter: Admitting: Obstetrics and Gynecology

## 2022-01-09 ENCOUNTER — Telehealth: Admitting: Obstetrics and Gynecology

## 2022-01-26 LAB — FETAL NONSTRESS TEST

## 2022-01-31 ENCOUNTER — Encounter: Admitting: Obstetrics and Gynecology

## 2022-08-21 ENCOUNTER — Encounter: Payer: Self-pay | Admitting: Obstetrics and Gynecology

## 2023-02-01 IMAGING — US US OB COMP LESS 14 WK
1 series · 15 of 25 positions shown · non-contrast
Comparison: None.

CLINICAL DATA: Dating

EXAM:
OBSTETRIC <14 WK ULTRASOUND
TECHNIQUE: Transabdominal ultrasound was performed for evaluation of the
gestation as well as the maternal uterus and adnexal regions.

[Series 1: us ob comp less 14 wks · 15 of 25 slices shown]
[im 1/25]
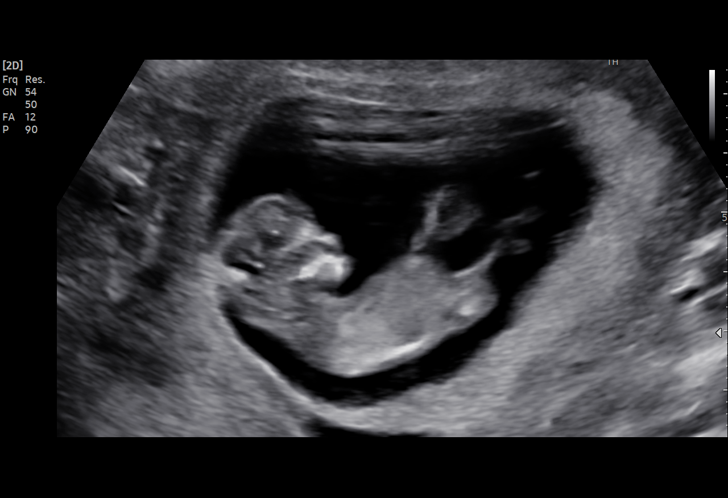
[im 3/25]
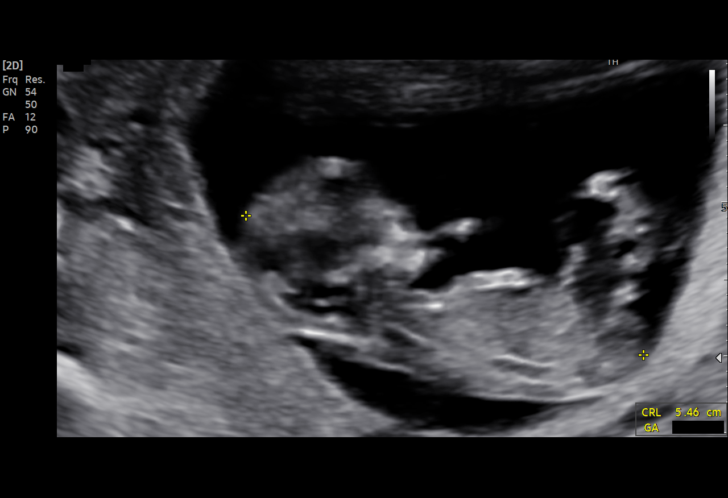
[im 5/25]
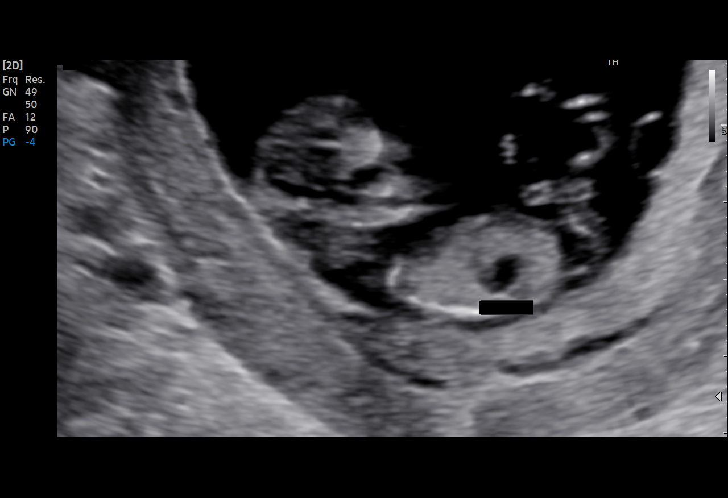
[im 6/25]
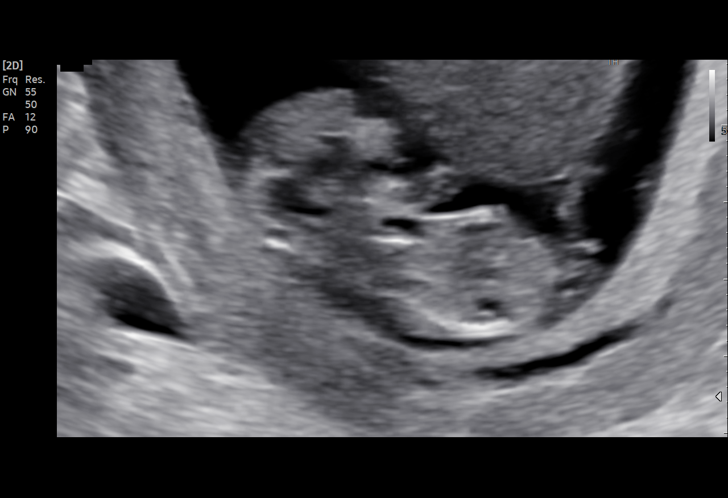
[im 8/25]
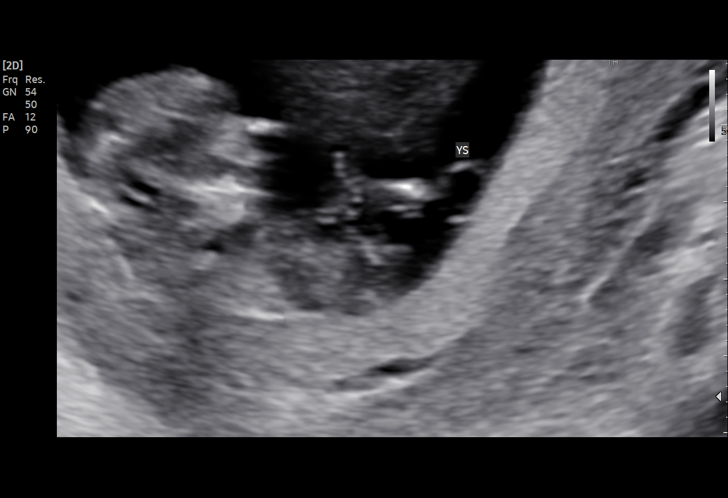
[im 10/25]
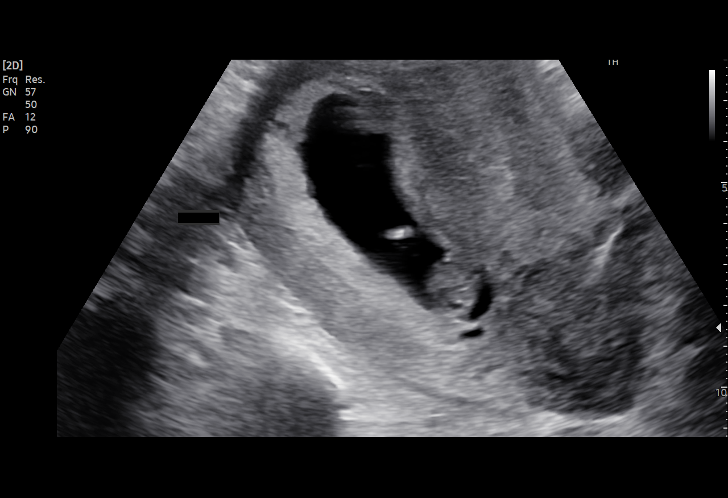
[im 11/25]
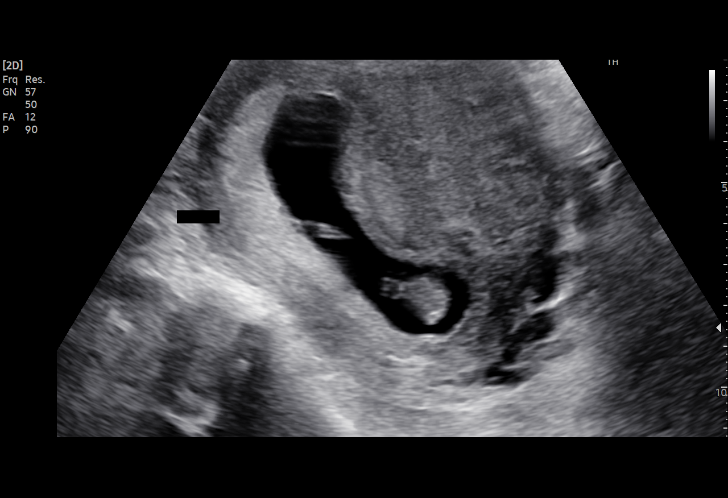
[im 13/25]
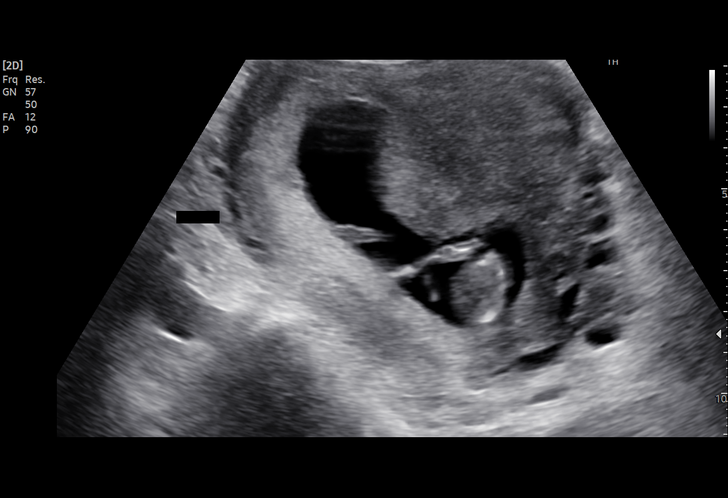
[im 15/25]
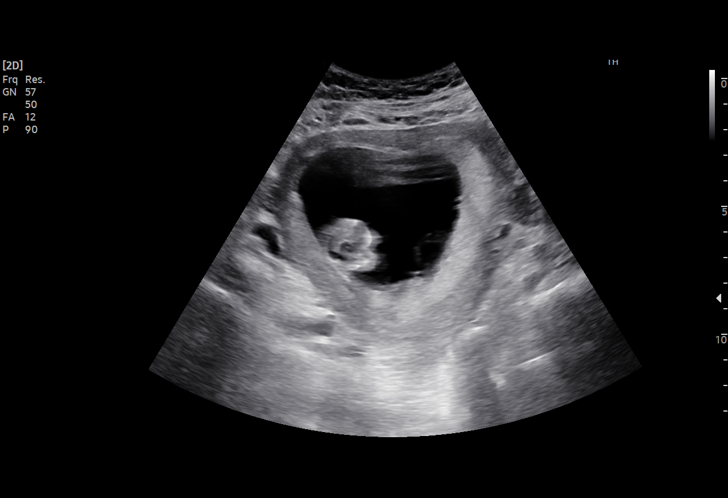
[im 16/25]
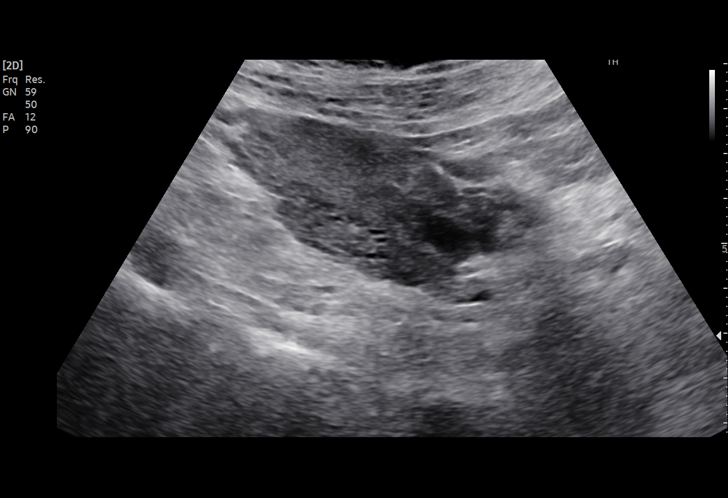
[im 18/25]
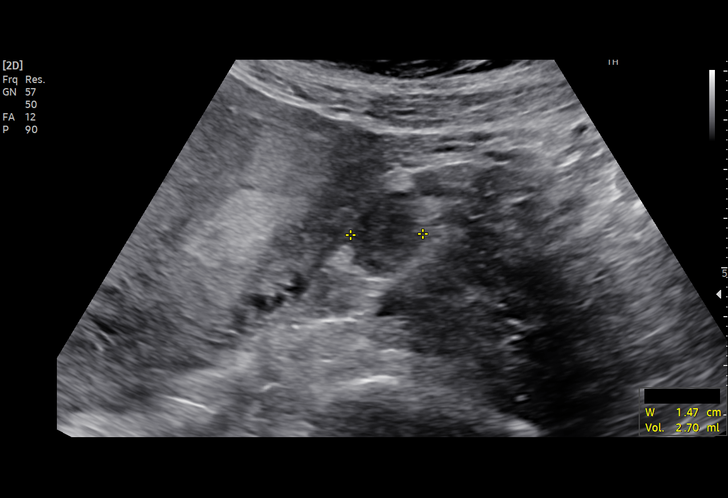
[im 20/25]
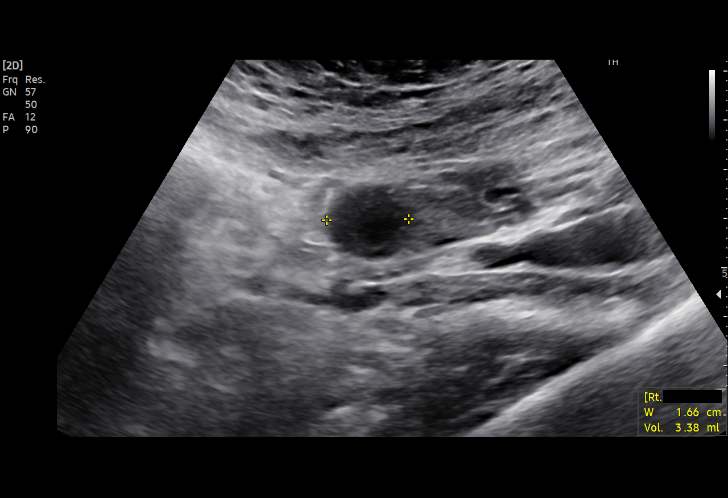
[im 21/25]
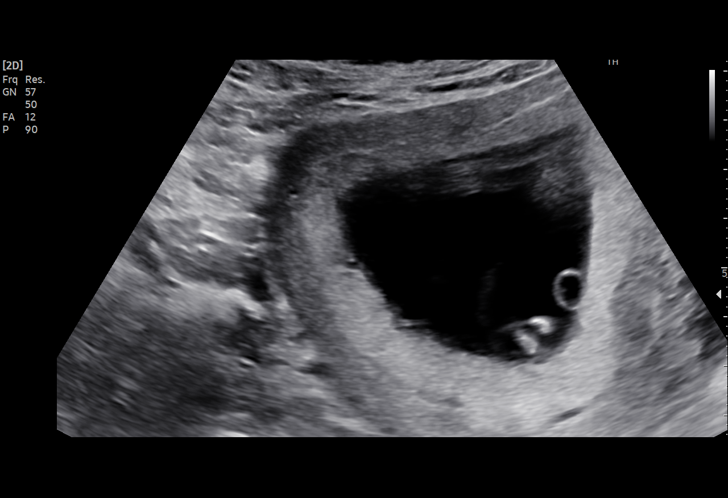
[im 23/25]
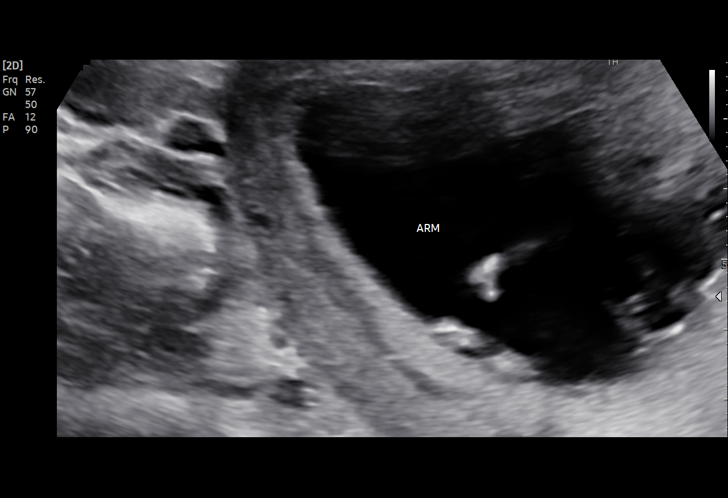
[im 25/25]
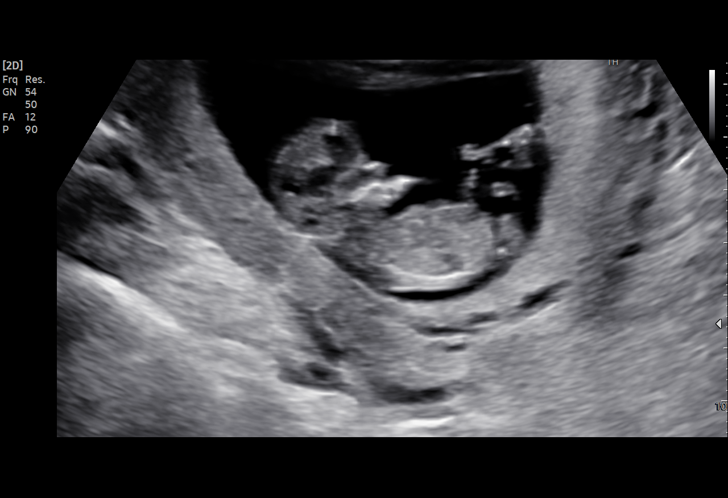

[15 of 25 positions shown; findings below may reference images not displayed]

FINDINGS: Intrauterine gestational sac: Single

Yolk sac:  Visualized.

Embryo:  Visualized.

Cardiac Activity: Visualized.

Heart Rate: 169 bpm

CRL: 54.9 mm   12 w 1 d                  US EDC: 12/31/2021

Subchorionic hemorrhage:  None visualized.

Maternal uterus/adnexae: Otherwise unremarkable appearance of the
maternal uterus. No concerning adnexal abnormalities. No pelvic free
fluid.
IMPRESSION: Single intrauterine gestation at 12 weeks, 1 day by crown-rump
length sonographic estimation.
